# Patient Record
Sex: Female | Born: 1965 | Race: White | Hispanic: No | Marital: Married | Smoking: Former smoker
Health system: Southern US, Community
[De-identification: ages and names within clinical notes are randomized; demographics above are authoritative.]

## PROBLEM LIST (undated history)

## (undated) DIAGNOSIS — F419 Anxiety disorder, unspecified: Secondary | ICD-10-CM

## (undated) DIAGNOSIS — T7840XA Allergy, unspecified, initial encounter: Secondary | ICD-10-CM

## (undated) HISTORY — DX: Anxiety disorder, unspecified: F41.9

## (undated) HISTORY — PX: OTHER SURGICAL HISTORY: SHX169

## (undated) HISTORY — DX: Allergy, unspecified, initial encounter: T78.40XA

## (undated) HISTORY — PX: COLONOSCOPY: SHX174

---

## 1989-01-21 HISTORY — PX: MANDIBLE SURGERY: SHX707

## 2001-03-11 ENCOUNTER — Other Ambulatory Visit: Admission: RE | Admit: 2001-03-11 | Discharge: 2001-03-11 | Payer: Self-pay | Admitting: Obstetrics and Gynecology

## 2002-03-25 ENCOUNTER — Other Ambulatory Visit: Admission: RE | Admit: 2002-03-25 | Discharge: 2002-03-25 | Payer: Self-pay | Admitting: Obstetrics and Gynecology

## 2002-07-27 ENCOUNTER — Inpatient Hospital Stay (HOSPITAL_COMMUNITY): Admission: RE | Admit: 2002-07-27 | Discharge: 2002-07-31 | Payer: Self-pay | Admitting: Psychiatry

## 2002-08-02 ENCOUNTER — Other Ambulatory Visit (HOSPITAL_COMMUNITY): Admission: RE | Admit: 2002-08-02 | Discharge: 2002-08-06 | Payer: Self-pay | Admitting: Psychiatry

## 2002-08-21 ENCOUNTER — Inpatient Hospital Stay (HOSPITAL_COMMUNITY): Admission: AD | Admit: 2002-08-21 | Discharge: 2002-08-26 | Payer: Self-pay | Admitting: Psychiatry

## 2005-09-18 ENCOUNTER — Ambulatory Visit: Payer: Self-pay | Admitting: Sports Medicine

## 2005-10-16 ENCOUNTER — Ambulatory Visit: Payer: Self-pay | Admitting: Sports Medicine

## 2005-10-18 ENCOUNTER — Encounter: Admission: RE | Admit: 2005-10-18 | Discharge: 2005-10-18 | Payer: Self-pay | Admitting: Sports Medicine

## 2005-11-06 ENCOUNTER — Encounter: Admission: RE | Admit: 2005-11-06 | Discharge: 2005-11-06 | Payer: Self-pay | Admitting: Sports Medicine

## 2005-11-13 ENCOUNTER — Ambulatory Visit: Payer: Self-pay | Admitting: Sports Medicine

## 2006-06-06 ENCOUNTER — Ambulatory Visit: Payer: Self-pay | Admitting: Sports Medicine

## 2006-06-06 DIAGNOSIS — S86819A Strain of other muscle(s) and tendon(s) at lower leg level, unspecified leg, initial encounter: Secondary | ICD-10-CM

## 2006-06-06 DIAGNOSIS — S838X9A Sprain of other specified parts of unspecified knee, initial encounter: Secondary | ICD-10-CM | POA: Insufficient documentation

## 2010-02-10 ENCOUNTER — Encounter: Payer: Self-pay | Admitting: Sports Medicine

## 2010-06-08 NOTE — Discharge Summary (Signed)
[name], Wheeler NO.:  [id number]   MEDICAL RECORD NO.:  [id number]                   PATIENT TYPE:  IPS   LOCATION:  0507                                 FACILITY:  [facility]   PHYSICIAN:  Jeanice Lim, M.D.              DATE OF BIRTH:  1965/08/21   DATE OF ADMISSION:  08/21/2002  DATE OF DISCHARGE:  08/26/2002                                 DISCHARGE SUMMARY   IDENTIFYING DATA:  This is a 45 year old married Caucasian female  voluntarily admitted, reporting a history of depression, feeling hopeless,  decreased energy, having suicidal thoughts to overdose due to overwhelming  anxiety.   MEDICATIONS:  Paxil CR 12.5 mg, Risperdal 0.25 mg and Klonopin 0.25 mg  t.i.d.  The patient had stopped these medications due to being worried about  side effects but then resumed two of the medications two days prior to  admission.   ALLERGIES:  No known drug allergies.   PHYSICAL EXAMINATION:  Essentially within normal limits.  Neurologically  nonfocal.   LABORATORY DATA:  Routine admission labs within normal limits including CBC  and CMET.   MENTAL STATUS EXAM:  Alert, cooperative female casually dressed.  Fair eye  contact.  Speech clear.  Mood depressed and anxious.  The patient described  feeling sad and extremely anxious.  Affect was somewhat labile, falling over  the arms of the chair sobbing.  Thought processes were goal directed and  thought content positive for hopelessness, helplessness, worthlessness,  feeling overwhelmed, some questionable obsessive ruminations, somatic fears,  ambivalence regarding medications and no psychotic symptoms.  Reported  suicidal thoughts but contracted for safety in the hospital.  Cognitively  intact.  Judgment and insight fair to poor.   ADMISSION DIAGNOSES:   AXIS I:  1. Generalized anxiety disorder.  2. Rule out obsessive-compulsive disorder.  3. Depression not otherwise specified.   AXIS II:   Deferred.   AXIS III:  None.   AXIS IV:  Moderate (limited support system).   AXIS V:  25/65.   HOSPITAL COURSE:  The patient was admitted and ordered routine p.r.n.  medications and underwent further monitoring.  Was encouraged to participate  in individual, group and milieu therapy.  The patient was resumed on Paxil,  Risperdal, and Klonopin and birth control pill that she was taking prior to  admission.  Paxil was optimized.  Risperdal was decreased.  Klonopin was  adjusted to minimize side effects.  The patient was educated the  risk/benefit ratio and alternatives regarding the medications and side  effects were reviewed on multiple occasions, especially in light of this  patient's ambivalence regarding taking medications at all, which may have  contributed to her readmission.  The patient reported a positive response,  increased insight and no side effects from medications.   CONDITION ON DISCHARGE:  Some improvement in condition at the time of  discharge.  Mood was less anxious, less  depressed.  Affect brighter.  Thought processes goal directed.  Thought content with less obsessive  ruminating.  Thoughts more goal directed, more reality-based.  Increased  problem-solving skills and cognition was intact.   DISCHARGE MEDICATIONS:  1. Vistaril 50 mg q.6h. p.r.n. anxiety.  2. Klonopin 0.5 mg, 1/2 q.a.m. and 1 q.h.s.  3. Paxil CR 12.5 mg, 2 q.a.m. and 1 at 6 p.m.  4. Risperdal 0.25 mg q.h.s.   FOLLOW UP:  The patient was to follow up with Dr. Raquel James on Monday, September 06, 2002 at 9:15 a.m. and [facility] on Thursday, September 02, 2002 at 11  a.m.   DISCHARGE DIAGNOSES:   AXIS I:  1. Generalized anxiety disorder.  2. Rule out obsessive-compulsive disorder.  3. Depression not otherwise specified.   AXIS II:  Deferred.   AXIS III:  None.   AXIS IV:  Moderate (limited support system).   AXIS V:  Global Assessment of Functioning on discharge 55.                                                Jeanice Lim, M.D.    JEM/MEDQ  D:  09/25/2002  T:  09/26/2002  Job:  [id number]

## 2010-06-08 NOTE — Discharge Summary (Signed)
NAMEPREZLEY, QADIR NO.:  [id number]   MEDICAL RECORD NO.:  [id number]                   PATIENT TYPE:  IPS   LOCATION:  0508                                 FACILITY:  [facility]   PHYSICIAN:  Geoffery Lyons, M.D.                   DATE OF BIRTH:  03-May-1965   DATE OF ADMISSION:  07/27/2002  DATE OF DISCHARGE:  07/31/2002                                 DISCHARGE SUMMARY   CHIEF COMPLAINT AND PRESENT ILLNESS:  This was the first admission to [facility] for this 45 year old healthy female, presenting to  the hospital feeling suicidal with thoughts of overdosing on Ambien.  History of anxiety, worries and panic, intrusive thoughts.  One year had  fantasies of having sex with movie stars and these resolved.  Then obsessive  thoughts for periods of weeks, obsessed about meds, about marriage.  Sleeping decreased, frequent awakening, unable to concentrate, feels  hopeless.  Lexapro for one month, making her feel apathetic.   PAST PSYCHIATRIC HISTORY:  [place] was for first inpatient treatment.  Byrd Hesselbach is a Warden/ranger at [facility].   ALCOHOL/DRUG HISTORY:  Two to four beers occasionally.   PAST MEDICAL HISTORY:  Chronic low back pain.   MEDICATIONS:  Lexapro 10 mg per day, Xanax 0.25 mg twice a day.   PHYSICAL EXAMINATION:  Performed and failed to show any acute findings.   MENTAL STATUS EXAM:  Fully alert, anxious female.  Labile mood, tearful but  cooperative.  Speech normal rate, production, tempo but, at times, some  pressure.  Mood anxiety.  Affect anxiety, tearful.  Thought processes  logical and coherent.  Positive for suicidal ruminations.  Positive for  intrusive thoughts and agitation.  Cognition well-preserved.   ADMISSION DIAGNOSES:   AXIS I:  1. Anxiety disorder not otherwise specified.  2. Mood disorder not otherwise specified.   AXIS II:  No diagnosis.   AXIS III:  Low back pain.   AXIS IV:   AXIS V:   HOSPITAL COURSE:  She was admitted and started intensive individual and  group psychotherapy.  She was given some Ambien for sleep, Risperdal 0.25 mg  at bedtime, Lexapro 10 mg in the morning.  Lexapro was increased to 15 mg  but then discontinued.  She was started on Paxil CR 12.5 mg per day.  She  was dealing with a lot of anxiety, becoming overwhelmed, lots of  ruminations, obsessive thoughts that triggered her anxiety, feeling  overwhelmed, losing control, like she cannot take it anymore, thoughts about  death, suicide as an option, to the pain, to the anxiety.  Mood anxiety and  depression.  Affect anxiety and depression.  Thoughts were dealing with the  events, feeling paralyzed, fearful.  She felt that the Lexapro was affecting  her negatively.  We gave it a couple of tries and, even on the 5 mg, she was  doing okay and then slowly experienced a lot of anxiety and mood  fluctuations.  So we went ahead and switched to Effexor.  She endorsed  obsessive, intrusive thoughts about marriage, anxiety, stress of relaxation,  worried about medication.  We gradually worked on Pharmacologist and  cognitive behavior ways of dealing with the anxiety.  She tolerated Paxil  well.  We went ahead and discharged to outpatient follow-up.   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   AXIS II:  No diagnosis.   AXIS III:  Low back pain.   AXIS IV:  Moderate.   AXIS V:  Global Assessment of Functioning upon discharge 55-60.   DISCHARGE MEDICATIONS:  1. Risperdal 0.25 mg at bedtime.  2. Xanax 0.25 mg twice a day.  3. Paxil CR 12.5 mg per day.   FOLLOW UP:  [facility] with Dr. Raquel James and mental health IOP.                                               Geoffery Lyons, M.D.    IL/MEDQ  D:  08/25/2002  T:  08/26/2002  Job:  [id number]

## 2010-06-08 NOTE — H&P (Signed)
Lori Wheeler, Lori NO.:  [id number]   MEDICAL RECORD NO.:  [id number]                   PATIENT TYPE:  IPS   LOCATION:  0507                                 FACILITY:  [facility]   PHYSICIAN:  Geoffery Lyons, M.D.                   DATE OF BIRTH:  [birth year]   DATE OF ADMISSION:  08/21/2002  DATE OF DISCHARGE:                         PSYCHIATRIC ADMISSION ASSESSMENT   IDENTIFYING INFORMATION:  This is a 45 year old married white female who is  a voluntary admission.   HISTORY OF PRESENT ILLNESS:  This 45 year old healthy female presented to  the hospital feeling suicidal, with thoughts of overdosing on her Ambien.  She has a history of 1 year of anxiety, with waves of panic and intrusive  thoughts of various types of fantasies, including having sex with movie  stars.  This seemed to resolve at one point during the year and then she  found herself with various types of obsessive thinking about her  medications, about her marriage, feeling unable to quiet her mind.  Her  sleep has been poor with frequent awakenings for the past 4-6 weeks.  She is  unable to concentrate, particularly at night, and feels hopeless about her  symptoms.  Her Lexapro she has taken for the past month she reports is  making her feel somewhat apathetic and numb.  She has never taken any other  type of medication.  The patient denies any homicidal ideation.  She  endorses suicidal thoughts with a place to overdose on Ambien.   PAST PSYCHIATRIC HISTORY:  The patient is followed by Dr. Jules Schick who  she has seen for only one visit.  This is her first inpatient treatment.  Prior to seeing Dr. Raquel James, her medications were managed by her primary  care physician.  She also sees Hilda Lias, a Warden/ranger at [employer].   SOCIAL HISTORY:  The patient is a married white female who is college  educated, with a Medical illustrator in Furniture conservator/restorer.  She works with  computers, has no  children, and lives at home with her husband.  No legal  charges.  Denies any history of substance abuse.   FAMILY HISTORY:  Remarkable for her mother with a history of addiction to  diet pills.  No family history of bipolar illness.   ALCOHOL AND DRUG HISTORY:  The patient does report she drinks somewhere  between 2-4 beers over the course of the week, has no symptoms of  withdrawal.   PAST MEDICAL HISTORY:  The patient is followed by Dr. Blossom Hoops, M.D., who  is her primary care physician.  Medical problems are chronic low back pain,  right elbow strain.  Past medical history is remarkable for no history of  seizures and the patient does have history of left jaw surgery in the  distant past.   MEDICATIONS:  The patient has been taking Lexapro 10 mg p.o. daily for  the  past month.  She also takes Xanax 0.25 mg approximately 2 times daily for  the past 6 weeks and takes oral contraceptives and is unable to remember the  name of the contraceptive.   DRUG ALLERGIES:  None.   REVIEW OF SYSTEMS:  The patient scores her low back pain as chronic, usually  relieved by ibuprofen or over-the-counter medications.  She scores it today  a 2/10.  She has no history of urinary tract infection or dysuria symptoms.   POSITIVE PHYSICAL FINDINGS:  A well-nourished, well-developed female, 5 feet  2 inches tall, 112 pounds.  Temperature 99.1, pulse 69, respirations 22,  blood pressure 130/87.  HEAD:  Normocephalic and atraumatic.  EENT:  PERRLA.  Sclerae are nonicteric.  NECK:  No thyromegaly, no lymphadenopathy.  CARDIOVASCULAR:  S1 and S2 is heard, regular rate and rhythm, no clicks,  murmurs, gallops or extra sounds.  LUNGS:  Clear to auscultation.  ABDOMEN:  Soft, nontender.  Bowel sounds within normal limits.  GENITALIA:  Deferred.  MUSCULOSKELETAL:  Normal gait, no swelling or erythema of any joints.  EXTREMITIES:  Pink and warm, no evidence of edema.  NEURO:  Cranial nerves II-XII intact.   EOMs intact with 1 beat of nystagmus  to the right.  Facial symmetry is present.  Grip strength equal bilaterally.  Deep tendon reflexes 3+/5 and are brisk.  Romberg without findings.   DIAGNOSTIC STUDIES:  CBC within normal limits.  Normal routine chemistry  although her potassium was very mildly decreased at 3.2 on a scale of 3.5-5  being normal.  Her total bilirubin was mildly elevated at 1.3 on a normal  scale of 0.3-1.2.  AST and ALT and alkaline phosphatase were normal.  Thyroid panel is currently pending..  Urine pregnancy test was negative.  Urine drug screen was positive for benzodiazepines, negative for all other  substances.   MENTAL STATUS EXAM:  This is a fully alert female with an anxious affect,  somewhat labile mood, generally cooperative, somewhat tearful at times.  Speech is within normal limits, no latency, no pressure.  Mood is anxious.  Thought process is logical and coherent, positive for suicidal ideation,  feeling hopeless and concerned about her own symptoms.  No homicidal  ideation.  No clear plan for suicidal action.  She does seem to be having  fairly consistent intrusive thoughts and some thought agitation from time to  time.  No clear auditory or visual hallucinations.  Cognitively she is  intact and oriented x3.   ADMISSION DIAGNOSIS:   AXIS I:  1. Anxiety disorder not otherwise specified.  2. Rule out Bipolar I.   AXIS II:  No diagnosis.   AXIS III:  Low back pain not otherwise specified.   AXIS IV:  Deferred.   AXIS V:  Current 35, past year 80.   INITIAL PLAN OF CARE:  To voluntarily admit the patient with q.15 minute  checks in place.  We are going to add Risperdal 0.25 mg p.o. q.h.s. and we  will continue her Xanax as is at 0.25 mg p.o. q.a.m.  We will continue her  Lexapro at 10 mg p.o. q.a.m. and Ambien 10 mg at h.s. p.r.n. and we will go from there to see if this will help her control her obsessive thinking and  impulsive thoughts, and  we will allow her to take her own oral contraceptive  tablets.  We have discussed the plan with her and she has asked some  pertinent questions.  We are going to give her  specific written information about the medications.  She is participating  appropriately in individual and group psychotherapy.  We will adjust  medications and will follow.   ESTIMATED LENGTH OF STAY:  5 days.      Margaret A. Scott, N.P.                   Geoffery Lyons, M.D.    MAS/MEDQ  D:  08/25/2002  T:  08/25/2002  Job:  [id number]

## 2010-06-08 NOTE — H&P (Signed)
Lori Wheeler, PEYSER NO.:  [id number]   MEDICAL RECORD NO.:  [id number]                   PATIENT TYPE:  IPS   LOCATION:  0507                                 FACILITY:  [facility]   PHYSICIAN:  Jeanice Lim, M.D.              DATE OF BIRTH:  [birth year]   DATE OF ADMISSION:  08/21/2002  DATE OF DISCHARGE:  08/26/2002                         PSYCHIATRIC ADMISSION ASSESSMENT   IDENTIFYING INFORMATION:  A 45 year old married white female, voluntarily  admitted on August 21, 2002.   HISTORY OF PRESENT ILLNESS:  The patient presents with a history of  depression, fell into despair, feeling hopeless, reports decreased energy.  She states that she has tried positive thinking but that is not working.  She has been having suicidal thoughts to overdose due to overwhelming  anxiety.  She states she is afraid of everything.  She is wondering if she  no longer loves her husband.  She is not as functional.  Afraid to drive.  She feels that she is a failure.  She is also very afraid to take her  medicines.  She states they are making her feel numb and considers she may  become addicted to her medications.  Her sleep has been decreased, her  appetite has been decreased.  She reports no weight loss.  Denies any  psychotic symptoms.   PAST PSYCHIATRIC HISTORY:  Second visit to [facility].  The  patient was here 3 weeks ago for suicidal ideation.  She went for her  outpatient appointment.  She sees Dr. Jules Schick and therapist Nichola Sizer.   SOCIAL HISTORY:  She is a 45 year old married white female, married for 9  years, first marriage, no children.  She lives with her husband.  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  She has completed her  bachelor's in computer programming.   FAMILY HISTORY:  None.   ALCOHOL DRUG HISTORY:  Nonsmoker, denies any alcohol or drug use.   PAST MEDICAL HISTORY:  Primary care Luvia Orzechowski is Dr.  Blossom Hoops at [facility].  Medical problems are none.   MEDICATIONS:  Paxil CR 12.5 mg daily, Risperdal 0.25 mg in the morning,  Klonopin 0.25 mg t.i.d.  The patient states she stopped these medications  because of being worried over the side effects but resumed her medications 2  days ago.   DRUG ALLERGIES:  No known allergies.   REVIEW OF SYSTEMS:  No cardiac, pulmonary, neurological problems.  She is a  nonsmoker.  No endocrine, GU, or GI, or reproductive problems.  Sustained an  injury to her neck and back a year ago.  Denies any chronic pain.   PHYSICAL EXAMINATION:  Vital signs 98.1, 96 heart rate, 18 respirations,  blood pressure 112/56.  The patient is 5 feet 3 inches tall, she is 111  pounds.  This is a thin, anxious appearing female.  HEAD:  Normocephalic and atraumatic.  CHEST:  Respirations are easy.  SKIN COLOR:  Olive toned, no rashes or lacerations were noted.  MUSCULOSKELETAL:  The patient walks very symmetrically.  NEURO:  Nonfocal findings.   LABORATORY DATA:  CBC within normal limits.  CMET within normal limits.   MENTAL STATUS EXAM:  She is an alert, cooperative female, casually dressed,  fair eye contact.  Speech is clear.  Mood:  The patient feels sad and  extremely anxious.  Her affect is labile, falls over the arms of her chair  sobbing.  Thought process is hopeless, overwhelmed, some questionable  obsessions and worries.  No hallucinations, expressing suicidal ideation but  promises safety.  Cognitive function intact.  Memory is good, judgment is  fair, insight is partial.   ADMISSION DIAGNOSES:   AXIS I:  Generalized anxiety disorder rule out bipolar disorder.   AXIS II:  Deferred.   AXIS III:  None.   AXIS IV:  Other psychosocial problems.   AXIS V:  Current is 25, past year 32.   PLAN:  Voluntary admission for anxiety and suicidal ideation.  Check every  15 minutes.  We will check labs as needed.  Stabilize mood and thinking so   the patient can be safe and functional.  We will continue with Klonopin and  decrease Paxil for now if it may be adding to mood instability.  Will  increased Risperdal for obsessive thinking.  Will have a family session.  Will consider a mood stabilizer. Will continue to educate the patient on her  medications and reinforce medication compliance.  The patient is to follow  up with Dr. Raquel James and her therapist.   TENTATIVE LENGTH OF CARE:  4-6 days.       Landry Corporal, N.P.                       Jeanice Lim, M.D.    JO/MEDQ  D:  08/26/2002  T:  08/26/2002  Job:  [id number]

## 2011-05-14 ENCOUNTER — Other Ambulatory Visit: Payer: Self-pay | Admitting: Family Medicine

## 2011-07-01 ENCOUNTER — Telehealth: Payer: Self-pay

## 2011-07-01 NOTE — Telephone Encounter (Signed)
Lori Wheeler at [facility] pharmacy [phone]  States he has sent two requests for prenatal vitamins 30 day supply may 10 last fill date.

## 2011-07-02 ENCOUNTER — Encounter: Payer: Self-pay | Admitting: Family Medicine

## 2011-07-02 MED ORDER — PRENATAL VITAMINS (DIS) PO TABS: 1.0000 | ORAL_TABLET | Freq: Every day | ORAL | Status: DC

## 2011-07-02 NOTE — Telephone Encounter (Signed)
Rx done and sent to pharmacy 

## 2011-07-02 NOTE — Telephone Encounter (Signed)
Chart is at nurses station for review 

## 2011-07-24 ENCOUNTER — Ambulatory Visit (INDEPENDENT_AMBULATORY_CARE_PROVIDER_SITE_OTHER): Payer: BC Managed Care – PPO | Admitting: Family Medicine

## 2011-07-24 ENCOUNTER — Encounter: Payer: Self-pay | Admitting: Family Medicine

## 2011-07-24 VITALS — BP 124/84 | HR 62 | Temp 97.2°F | Resp 16 | Ht 62.5 in | Wt 161.2 lb

## 2011-07-24 DIAGNOSIS — E7800 Pure hypercholesterolemia, unspecified: Secondary | ICD-10-CM

## 2011-07-24 DIAGNOSIS — E7801 Familial hypercholesterolemia: Secondary | ICD-10-CM

## 2011-07-24 DIAGNOSIS — E663 Overweight: Secondary | ICD-10-CM

## 2011-07-24 DIAGNOSIS — Z0000 Encounter for general adult medical examination without abnormal findings: Secondary | ICD-10-CM

## 2011-07-24 LAB — POCT URINALYSIS DIPSTICK
Protein, UA: NEGATIVE
Spec Grav, UA: 1.015
Urobilinogen, UA: 0.2

## 2011-07-24 MED ORDER — PAROXETINE HCL 10 MG PO TABS: 10.0000 mg | ORAL_TABLET | ORAL | Status: DC

## 2011-07-24 NOTE — Progress Notes (Signed)
  Subjective:    Patient ID: Lori Wheeler, female    DOB: 04-02-1965, 46 y.o.   MRN: [id number]  HPI   THis 46 y.o. Female is here for CPE; PAPs are done at [facility] (OCPs prescribed by  M.D. There). The pt is married, works as a Quarry manager, is a nonsmoker and consumes beer regularly.  Exercise- runner.    Last PAP: 07/2010- normal  Last MMG: 07/2009- normal/neg    Review of Systems  Constitutional: Negative.   HENT: Negative.   Eyes: Negative.   Respiratory: Negative.   Cardiovascular: Negative.   Gastrointestinal: Negative.   Genitourinary: Negative.   Musculoskeletal: Negative.   Skin: Negative.   Neurological: Negative.   Hematological: Negative.   Psychiatric/Behavioral: Negative.        Objective:   Physical Exam  Nursing note and vitals reviewed. Constitutional: She is oriented to person, place, and time. She appears well-developed and well-nourished. No distress.  HENT:  Head: Normocephalic and atraumatic.  Right Ear: Hearing, tympanic membrane, external ear and ear canal normal.  Left Ear: Hearing, tympanic membrane, external ear and ear canal normal.  Nose: Nose normal.  Mouth/Throat: Uvula is midline, oropharynx is clear and moist and mucous membranes are normal. Normal dentition.  Eyes: Conjunctivae, EOM and lids are normal. Pupils are equal, round, and reactive to light. No scleral icterus.  Neck: Normal range of motion. Neck supple. No thyromegaly present.  Cardiovascular: Normal rate, regular rhythm, normal heart sounds and intact distal pulses.  Exam reveals no gallop and no friction rub.   No murmur heard. Pulmonary/Chest: Effort normal and breath sounds normal. No respiratory distress. She has no wheezes. Right breast exhibits no inverted nipple, no mass, no nipple discharge, no skin change and no tenderness. Left breast exhibits no inverted nipple, no mass, no nipple discharge and no tenderness. Breasts are symmetrical.  Abdominal: Bowel  sounds are normal. She exhibits no mass. There is no hepatosplenomegaly. There is no tenderness. There is no guarding and no CVA tenderness.  Musculoskeletal: Normal range of motion. She exhibits no edema and no tenderness.  Lymphadenopathy:    She has no cervical adenopathy.  Neurological: She is alert and oriented to person, place, and time. She has normal reflexes. No cranial nerve deficit. She exhibits normal muscle tone. Coordination normal.  Skin: Skin is warm and dry. No rash noted. No pallor.  Psychiatric: She has a normal mood and affect. Her behavior is normal. Judgment and thought content normal.          Assessment & Plan:   1. Routine general medical examination at a health care facility  POCT urinalysis dipstick  2. Familial hypercholesterolemia  Lipid panel  3. Overweight (BMI 25.0-29.9)  Encourage continued exercise and better nutrition with portion control to achieve weight loss goal

## 2011-07-24 NOTE — Progress Notes (Signed)
  Subjective:    Patient ID: Lori Wheeler, female    DOB: [birth year], 46 y.o.   MRN: [id number]  HPI    Review of Systems     Objective:   Physical Exam        Assessment & Plan:

## 2011-07-24 NOTE — Patient Instructions (Addendum)
Keeping You Healthy  Get These Tests 1. Blood Pressure- Have your blood pressure checked once a year by your health care provider.  Normal blood pressure is 120/80. 2. Weight- Have your body mass index (BMI) calculated to screen for obesity.  BMI is measure of body fat based on height and weight.  You can also calculate your own BMI at https://www.west-esparza.com/. 3. Cholesterol- Have your cholesterol checked every 5 years starting at age 44 then yearly starting at age 43. 4. Chlamydia, HIV, and other sexually transmitted diseases- Get screened every year until age 57, then within three months of each new sexual provider. 5. Pap Smear- Every 1-3 years; discuss with your health care provider. 6. Mammogram- Every year starting at age 3  Take these medicines  Calcium with Vitamin D-Your body needs 1200 mg of Calcium each day and [phone] IU of Vitamin D daily.  Your body can only absorb 500 mg of Calcium at a time so Calcium must be taken in 2 or 3 divided doses throughout the day.  Multivitamin with folic acid- Once daily if it is possible for you to become pregnant.  Get these Immunizations  Gardasil-Series of three doses; prevents HPV related illness such as genital warts and cervical cancer.  Menactra-Single dose; prevents meningitis.  Tetanus shot- Every 10 years.  Check with your GYN office to see if you were given the Tdap in 2004-2005. If not, you will need 1 dose of this vaccine.  Flu shot-Every year.  Take these steps 1. Do not smoke-Your healthcare provider can help you quit.  For tips on how to quit go to www.smokefree.gov or call 1-800 QUITNOW. 2. Be physically active- Exercise 5 days a week for at least 30 minutes.  If you are not already physically active, start slow and gradually work up to 30 minutes of moderate physical activity.  Examples of moderate activity include walking briskly, dancing, swimming, bicycling, etc. 3. Breast Cancer- A self breast exam every month is  important for early detection of breast cancer.  For more information and instruction on self breast exams, ask your healthcare provider or [email or web address]. 4. Eat a healthy diet- Eat a variety of healthy foods such as fruits, vegetables, whole grains, low fat milk, low fat cheeses, yogurt, lean meats, poultry and fish, beans, nuts, tofu, etc.  For more information go to www. Thenutritionsource.org 5. Drink alcohol in moderation- Limit alcohol intake to one drink or less per day. Never drink and drive. 6. Depression- Your emotional health is as important as your physical health.  If you're feeling down or losing interest in things you normally enjoy please talk to your healthcare provider about being screened for depression. 7. Dental visit- Brush and floss your teeth twice daily; visit your dentist twice a year. 8. Eye doctor- Get an eye exam at least every 2 years. 9. Helmet use- Always wear a helmet when riding a bicycle, motorcycle, rollerblading or skateboarding. 10. Safe sex- If you may be exposed to sexually transmitted infections, use a condom. 11. Seat belts- Seat belts can save your live; always wear one. 12. Smoke/Carbon Monoxide detectors- These detectors need to be installed on the appropriate level of your home. Replace batteries at least once a year. 13. Skin cancer- When out in the sun please cover up and use sunscreen 15 SPF or higher. 14. Violence- If anyone is threatening or hurting you, please tell your healthcare provider.

## 2011-07-25 LAB — LIPID PANEL
Cholesterol: 205 mg/dL — ABNORMAL HIGH (ref 0–200)
LDL Cholesterol: 106 mg/dL — ABNORMAL HIGH (ref 0–99)
VLDL: 29 mg/dL (ref 0–40)

## 2011-08-01 ENCOUNTER — Encounter: Payer: Self-pay | Admitting: Family Medicine

## 2011-08-01 ENCOUNTER — Encounter: Payer: Self-pay | Admitting: *Deleted

## 2011-08-01 NOTE — Progress Notes (Signed)
Quick Note:  Please notify pt that results are normal.   Provide pt with copy of labs. ______ 

## 2012-05-17 ENCOUNTER — Ambulatory Visit (INDEPENDENT_AMBULATORY_CARE_PROVIDER_SITE_OTHER): Payer: BC Managed Care – PPO | Admitting: Family Medicine

## 2012-05-17 VITALS — BP 102/64 | HR 95 | Temp 99.2°F | Resp 18 | Ht 62.5 in | Wt 166.0 lb

## 2012-05-17 DIAGNOSIS — N390 Urinary tract infection, site not specified: Secondary | ICD-10-CM

## 2012-05-17 DIAGNOSIS — J069 Acute upper respiratory infection, unspecified: Secondary | ICD-10-CM

## 2012-05-17 MED ORDER — HYDROCODONE-HOMATROPINE 5-1.5 MG/5ML PO SYRP: 5.0000 mL | ORAL_SOLUTION | Freq: Three times a day (TID) | ORAL | Status: DC | PRN

## 2012-05-17 MED ORDER — AZITHROMYCIN 250 MG PO TABS: ORAL_TABLET | ORAL | Status: DC

## 2012-05-17 NOTE — Progress Notes (Signed)
Cough began yesterday (husband just had the flu)   Generalized Body Aches    Shortness of Breath    Sore Throat    Patient has been feeling bad for two weeks, but the cough began yesterday.  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  Associated symptoms:  Night sweats and chills  PMHx:  Non smoker, no h/o asthma  Objective:  NAD HEENT:  Mild oroph erythema Neck:  Supple, no adenopathy Chest:  Few ronchi Heart:  Reg, no murmur  Assessment:  URI  Plan:  z pak and hydromet  Signed,  Elvina Sidle, MD

## 2012-08-10 ENCOUNTER — Other Ambulatory Visit: Payer: Self-pay | Admitting: Family Medicine

## 2012-08-12 NOTE — Telephone Encounter (Signed)
Needs OV.  

## 2012-08-20 ENCOUNTER — Encounter: Payer: Self-pay | Admitting: Family Medicine

## 2012-08-20 ENCOUNTER — Ambulatory Visit (INDEPENDENT_AMBULATORY_CARE_PROVIDER_SITE_OTHER): Payer: BC Managed Care – PPO | Admitting: Family Medicine

## 2012-08-20 VITALS — BP 123/81 | HR 68 | Temp 97.3°F | Resp 16 | Ht 63.5 in | Wt 166.0 lb

## 2012-08-20 DIAGNOSIS — G8929 Other chronic pain: Secondary | ICD-10-CM

## 2012-08-20 DIAGNOSIS — M25521 Pain in right elbow: Secondary | ICD-10-CM

## 2012-08-20 DIAGNOSIS — M25529 Pain in unspecified elbow: Secondary | ICD-10-CM

## 2012-08-20 DIAGNOSIS — F411 Generalized anxiety disorder: Secondary | ICD-10-CM

## 2012-08-20 MED ORDER — PAROXETINE HCL 10 MG PO TABS: 10.0000 mg | ORAL_TABLET | ORAL | Status: DC

## 2012-08-20 NOTE — Patient Instructions (Signed)
You have been referred to [facility] Medicine [facility] for thorough evaluation of right elbow pain. Continue to wear the sleeve and use a topical analgesic as well as try an OTC medication for pain and inflammation (Aleve or Ibuprofen) as end of the day. A staff member will contact you with the information about your appointment to [facility] Med Clinic.

## 2012-08-20 NOTE — Progress Notes (Signed)
S:  This 47 y.o. Cauc female has chronic anxiety, well controlled on Paroxetine 10 mg; she reports no adverse effects. Pt is requesting refills. She does not report recent panic attacks, CP or tightness, diaphoresis, palpitations, SOB or dyspnea, GI upset or decrease appetite, HA, dizziness, tremor, sleep disturbance, agitation or confusion or concentration difficulties.   Pt c/o recurrent right elbow pain; she had similar pain and tightness around R elbow years ago. Pt self- diagnosed as "tennis elbow" and Neoprene sleeve is helpful. She has tried massage and discontinued physical activity that aggravates pain (plank poses, reaching out and behind her, etc). Pt denies paresthesias, weakness or numbness in arm. There is no hx of acute injury. She has not taken any NSAIDs or tried any topical analgesic as she wanted to get an accurate diagnosis.  Patient Active Problem List   Diagnosis Date Noted  . MUSCLE STRAIN, HAMSTRING MUSCLE 06/06/2006   PMHx, Soc Hx and Fam Hx reviewed.  ROS: As per HPI; otherwise noncontributory.  O: Filed Vitals:   08/20/12 1007  BP: 123/81  Pulse: 68  Temp: 97.3 F (36.3 C)  Resp: 16   GEN: In NAD; WN,WD. HENT: /AT; EOMI w/ clear conj/ sclerae. Otherwise unremarkable. COR: RRR. LUNGS: Normal resp rate and effort. SKIN: W&D; no erythema or pallor. MS: R arm/elbow- no deformity or effusion. Full ROM but expressed discomfort w/ full extension and internal/ext rotation.          Minimal point tenderness over olecranon process. NEURO: A&O x 3; CNs intact. Nonfocal.  A/P: Elbow pain, chronic, right - Plan: Ambulatory referral to Sports Medicine- Continue to wear sleeve and try oral NSAID as well as topical analgesic.  Anxiety state, unspecified- Stable on current medication.  Meds ordered this encounter  Medications  . PARoxetine (PAXIL) 10 MG tablet    Sig: Take 1 tablet (10 mg total) by mouth every morning.    Dispense:  90 tablet    Refill:  3

## 2012-08-21 ENCOUNTER — Ambulatory Visit: Payer: BC Managed Care – PPO | Admitting: Family Medicine

## 2012-08-28 ENCOUNTER — Ambulatory Visit (INDEPENDENT_AMBULATORY_CARE_PROVIDER_SITE_OTHER): Payer: BC Managed Care – PPO | Admitting: Sports Medicine

## 2012-08-28 ENCOUNTER — Encounter: Payer: Self-pay | Admitting: Sports Medicine

## 2012-08-28 VITALS — BP 111/84 | HR 73 | Ht 62.5 in | Wt 166.0 lb

## 2012-08-28 DIAGNOSIS — M25529 Pain in unspecified elbow: Secondary | ICD-10-CM

## 2012-08-28 DIAGNOSIS — M7711 Lateral epicondylitis, right elbow: Secondary | ICD-10-CM

## 2012-08-28 DIAGNOSIS — M25521 Pain in right elbow: Secondary | ICD-10-CM | POA: Insufficient documentation

## 2012-08-28 DIAGNOSIS — M7710 Lateral epicondylitis, unspecified elbow: Secondary | ICD-10-CM

## 2012-08-28 NOTE — Progress Notes (Signed)
  Subjective:    Patient ID: Lori Wheeler, female    DOB: 1965/09/15, 47 y.o.   MRN: [id number]  HPI This is a 47 year old white female who presents to the sports medicine clinic today for evaluation of right elbow pain onset 2 months ago. She started at a running routine as well as core exercises which included planks and carrying a water bottle while she runs.  She does pain on the outside of her right elbow worse with lifting things up or direct touch. 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 No weakness. No traumatic injury noted. She had a similar episode but milder 2 years ago but she did not seek medical attention for and resolved on its own. She has not been taking any anti-inflammatories. She has been trying to stretch.  Past Medical History  Diagnosis Date  . Anxiety   . Allergy    Past Surgical History  Procedure Laterality Date  . Mandible surgery  1991   No Known Allergies    Review of Systems As per history of present illness otherwise negative.vs     Objective:   Physical Exam BP 111/84  Pulse 73  Ht 5' 2.5" (1.588 m)  Wt 166 lb (75.297 kg)  BMI 29.86 kg/m2 This is a well-developed well-nourished pleasant 47 year old female in no acute distress alert and oriented.  Right elbow: Tender to palpation over the lateral epicondyle. No significant joint effusion full range of motion to flexion extension pronation and supination Strength 5 out of 5 in all directions Pain reproduced with forced dorsiflexion of the right wrist No ecchymosis noted  Examination of the left elbow is unremarkable.  The bilateral upper extremities neurovascularly intact with equal pulses  Musculoskeletal ultrasound of the right elbow Ultrasound was performed with visualization of the right elbow. Views were obtained longitudinally and transverse. The lateral epicondyle tendon andradiohumeral joint well visualized. Evidence of fluid within the  common extensor tendon was seen consistent with lateral epicondylitis no evidence of tear was noted however.

## 2012-08-28 NOTE — Assessment & Plan Note (Signed)
A wrist immobilization splint was given today to help prevent flexion-extension of the wrist. She was given a prescription for physical therapy with Jacquiline Doe.  She declined anti-inflammatories at this time. She'll followup in 4 weeks for reevaluation. At that time or if her symptoms do not improve we will discuss corticosteroid injection or topical nitroglycerin therapy

## 2012-08-28 NOTE — Patient Instructions (Addendum)
Lateral Epicondylitis (Tennis Elbow) with Rehab Lateral epicondylitis involves inflammation and pain around the outer portion of the elbow. The pain is caused by inflammation of the tendons in the forearm that bring back (extend) the wrist. Lateral epicondylittis is also called tennis elbow, because it is very common in tennis players. However, it may occur in any individual who extends the wrist repetitively. If lateral epicondylitis is left untreated, it may become a chronic problem. SYMPTOMS   Pain, tenderness, and inflammation on the outer (lateral) side of the elbow.  Pain or weakness with gripping activities.  Pain that increases with wrist twisting motions (playing tennis, using a screwdriver, opening a door or a jar).  Pain with lifting objects, including a coffee cup. CAUSES  Lateral epicondylitis is caused by inflammation of the tendons that extend the wrist. Causes of injury may include:  Repetitive stress and strain on the muscles and tendons that extend the wrist.  Sudden change in activity level or intensity.  Incorrect grip in racquet sports.  Incorrect grip size of racquet (often too large).  Incorrect hitting position or technique (usually backhand, leading with the elbow).  Using a racket that is too heavy. RISK INCREASES WITH:  Sports or occupations that require repetitive and/or strenuous forearm and wrist movements (tennis, squash, racquetball, carpentry).  Poor wrist and forearm strength and flexibility.  Failure to warm up properly before activity.  Resuming activity before healing, rehabilitation, and conditioning are complete. PREVENTION   Warm up and stretch properly before activity.  Maintain physical fitness:  Strength, flexibility, and endurance.  Cardiovascular fitness.  Wear and use properly fitted equipment.  Learn and use proper technique and have a coach correct improper technique.  Wear a tennis elbow (counterforce) brace. PROGNOSIS   The course of this condition depends on the degree of the injury. If treated properly, acute cases (symptoms lasting less than 4 weeks) are often resolved in 2 to 6 weeks. Chronic (longer lasting cases) often resolve in 3 to 6 months, but may require physical therapy. RELATED COMPLICATIONS   Frequently recurring symptoms, resulting in a chronic problem. Properly treating the problem the first time decreases frequency of recurrence.  Chronic inflammation, scarring tendon degeneration, and partial tendon tear, requiring surgery.  Delayed healing or resolution of symptoms. TREATMENT  Treatment first involves the use of ice and medicine, to reduce pain and inflammation. Strengthening and stretching exercises may help reduce discomfort, if performed regularly. These exercises may be performed at home, if the condition is an acute injury. Chronic cases may require a referral to a physical therapist for evaluation and treatment. Your caregiver may advise a corticosteroid injection, to help reduce inflammation. Rarely, surgery is needed. MEDICATION  If pain medicine is needed, nonsteroidal anti-inflammatory medicines (aspirin and ibuprofen), or other minor pain relievers (acetaminophen), are often advised.  Do not take pain medicine for 7 days before surgery.  Prescription pain relievers may be given, if your caregiver thinks they are needed. Use only as directed and only as much as you need.  Corticosteroid injections may be recommended. These injections should be reserved only for the most severe cases, because they can only be given a certain number of times. HEAT AND COLD  Cold treatment (icing) should be applied for 10 to 15 minutes every 2 to 3 hours for inflammation and pain, and immediately after activity that aggravates your symptoms. Use ice packs or an ice massage.  Heat treatment may be used before performing stretching and strengthening activities prescribed by your   caregiver, physical  therapist, or athletic trainer. Use a heat pack or a warm water soak. SEEK MEDICAL CARE IF: Symptoms get worse or do not improve in 2 weeks, despite treatment. EXERCISES  RANGE OF MOTION (ROM) AND STRETCHING EXERCISES - Epicondylitis, Lateral (Tennis Elbow) These exercises may help you when beginning to rehabilitate your injury. Your symptoms may go away with or without further involvement from your physician, physical therapist or athletic trainer. While completing these exercises, remember:   Restoring tissue flexibility helps normal motion to return to the joints. This allows healthier, less painful movement and activity.  An effective stretch should be held for at least 30 seconds.  A stretch should never be painful. You should only feel a gentle lengthening or release in the stretched tissue. RANGE OF MOTION  Wrist Flexion, Active-Assisted  Extend your right / left elbow with your fingers pointing down.*  Gently pull the back of your hand towards you, until you feel a gentle stretch on the top of your forearm.  Hold this position for __________ seconds. Repeat __________ times. Complete this exercise __________ times per day.  *If directed by your physician, physical therapist or athletic trainer, complete this stretch with your elbow bent, rather than extended. RANGE OF MOTION  Wrist Extension, Active-Assisted  Extend your right / left elbow and turn your palm upwards.*  Gently pull your palm and fingertips back, so your wrist extends and your fingers point more toward the ground.  You should feel a gentle stretch on the inside of your forearm.  Hold this position for __________ seconds. Repeat __________ times. Complete this exercise __________ times per day. *If directed by your physician, physical therapist or athletic trainer, complete this stretch with your elbow bent, rather than extended. STRETCH - Wrist Flexion  Place the back of your right / left hand on a tabletop,  leaving your elbow slightly bent. Your fingers should point away from your body.  Gently press the back of your hand down onto the table by straightening your elbow. You should feel a stretch on the top of your forearm.  Hold this position for __________ seconds. Repeat __________ times. Complete this stretch __________ times per day.  STRETCH  Wrist Extension   Place your right / left fingertips on a tabletop, leaving your elbow slightly bent. Your fingers should point backwards.  Gently press your fingers and palm down onto the table by straightening your elbow. You should feel a stretch on the inside of your forearm.  Hold this position for __________ seconds. Repeat __________ times. Complete this stretch __________ times per day.  STRENGTHENING EXERCISES - Epicondylitis, Lateral (Tennis Elbow) These exercises may help you when beginning to rehabilitate your injury. They may resolve your symptoms with or without further involvement from your physician, physical therapist or athletic trainer. While completing these exercises, remember:   Muscles can gain both the endurance and the strength needed for everyday activities through controlled exercises.  Complete these exercises as instructed by your physician, physical therapist or athletic trainer. Increase the resistance and repetitions only as guided.  You may experience muscle soreness or fatigue, but the pain or discomfort you are trying to eliminate should never worsen during these exercises. If this pain does get worse, stop and make sure you are following the directions exactly. If the pain is still present after adjustments, discontinue the exercise until you can discuss the trouble with your caregiver. STRENGTH Wrist Flexors  Sit with your right / left forearm palm-up and   fully supported on a table or countertop. Your elbow should be resting below the height of your shoulder. Allow your wrist to extend over the edge of the  surface.  Loosely holding a __________ weight, or a piece of rubber exercise band or tubing, slowly curl your hand up toward your forearm.  Hold this position for __________ seconds. Slowly lower the wrist back to the starting position in a controlled manner. Repeat __________ times. Complete this exercise __________ times per day.  STRENGTH  Wrist Extensors  Sit with your right / left forearm palm-down and fully supported on a table or countertop. Your elbow should be resting below the height of your shoulder. Allow your wrist to extend over the edge of the surface.  Loosely holding a __________ weight, or a piece of rubber exercise band or tubing, slowly curl your hand up toward your forearm.  Hold this position for __________ seconds. Slowly lower the wrist back to the starting position in a controlled manner. Repeat __________ times. Complete this exercise __________ times per day.  STRENGTH - Ulnar Deviators  Stand with a ____________________ weight in your right / left hand, or sit while holding a rubber exercise band or tubing, with your healthy arm supported on a table or countertop.  Move your wrist, so that your pinkie travels toward your forearm and your thumb moves away from your forearm.  Hold this position for __________ seconds and then slowly lower the wrist back to the starting position. Repeat __________ times. Complete this exercise __________ times per day STRENGTH - Radial Deviators  Stand with a ____________________ weight in your right / left hand, or sit while holding a rubber exercise band or tubing, with your injured arm supported on a table or countertop.  Raise your hand upward in front of you or pull up on the rubber tubing.  Hold this position for __________ seconds and then slowly lower the wrist back to the starting position. Repeat __________ times. Complete this exercise __________ times per day. STRENGTH  Forearm Supinators   Sit with your right /  left forearm supported on a table, keeping your elbow below shoulder height. Rest your hand over the edge, palm down.  Gently grip a hammer or a soup ladle.  Without moving your elbow, slowly turn your palm and hand upward to a "thumbs-up" position.  Hold this position for __________ seconds. Slowly return to the starting position. Repeat __________ times. Complete this exercise __________ times per day.  STRENGTH  Forearm Pronators   Sit with your right / left forearm supported on a table, keeping your elbow below shoulder height. Rest your hand over the edge, palm up.  Gently grip a hammer or a soup ladle.  Without moving your elbow, slowly turn your palm and hand upward to a "thumbs-up" position.  Hold this position for __________ seconds. Slowly return to the starting position. Repeat __________ times. Complete this exercise __________ times per day.  STRENGTH - Grip  Grasp a tennis ball, a dense sponge, or a large, rolled sock in your hand.  Squeeze as hard as you can, without increasing any pain.  Hold this position for __________ seconds. Release your grip slowly. Repeat __________ times. Complete this exercise __________ times per day.  STRENGTH - Elbow Extensors, Isometric  Stand or sit upright, on a firm surface. Place your right / left arm so that your palm faces your stomach, and it is at the height of your waist.  Place your opposite hand on   the underside of your forearm. Gently push up as your right / left arm resists. Push as hard as you can with both arms, without causing any pain or movement at your right / left elbow. Hold this stationary position for __________ seconds. Gradually release the tension in both arms. Allow your muscles to relax completely before repeating. Document Released: 01/07/2005 Document Revised: 04/01/2011 Document Reviewed: 04/21/2008 [employer] Patient Information 2014 [employer].  

## 2012-09-28 ENCOUNTER — Ambulatory Visit: Payer: BC Managed Care – PPO | Admitting: Sports Medicine

## 2012-10-07 ENCOUNTER — Ambulatory Visit (INDEPENDENT_AMBULATORY_CARE_PROVIDER_SITE_OTHER): Payer: BC Managed Care – PPO | Admitting: Sports Medicine

## 2012-10-07 VITALS — BP 118/82 | Ht 62.0 in | Wt 160.0 lb

## 2012-10-07 DIAGNOSIS — M25529 Pain in unspecified elbow: Secondary | ICD-10-CM

## 2012-10-07 DIAGNOSIS — M7710 Lateral epicondylitis, unspecified elbow: Secondary | ICD-10-CM

## 2012-10-07 DIAGNOSIS — M7711 Lateral epicondylitis, right elbow: Secondary | ICD-10-CM

## 2012-10-07 DIAGNOSIS — M25521 Pain in right elbow: Secondary | ICD-10-CM

## 2012-10-07 MED ORDER — METHYLPREDNISOLONE ACETATE 40 MG/ML IJ SUSP
40.0000 mg | Freq: Once | INTRAMUSCULAR | Status: AC
  Administered 2012-10-07: 40 mg via INTRA_ARTICULAR

## 2012-10-07 NOTE — Progress Notes (Signed)
  Subjective:    Patient ID: Lori Wheeler, female    DOB: [birth year], 47 y.o.   MRN: [id number]  HPI Patient comes in today for followup on right elbow lateral epicondylitis. Still having pain despite several physical therapy treatments. She continues to localize the pain to the lateral elbow with some radiating pain into the forearm. No numbness and tingling. Continues to be worse with wrist related activities such as grip and twisting. She has been wearing her wrist brace intermittently. Symptoms have been present now for about 3 months. Ultrasound at last visit showed changes in the common extensor tendon consistent with tendinitis but no discrete tear was seen.    Review of Systems     Objective:   Physical Exam Well-developed, well-nourished. No acute distress. Awake alert and oriented x3  Right elbow: Full range of motion. No effusion. No soft tissue swelling. There is tenderness to palpation directly over the lateral epicondyle with reproducible pain with resisted ECRB testing. No tenderness over the medial epicondyle. No tenderness to palpation at the radial tunnel. Decreased grip strength secondary to pain. Good radial and ulnar pulses.       Assessment & Plan:  Persistent lateral right elbow pain secondary to lateral epicondylitis  Patient's right elbow was injected today. 1 cc of 40 mg Depo-Medrol and 1 cc of 1% Xylocaine was injected into the common extensor tendon origin. This was done atraumatically and under sterile technique after risks and benefits were explained to the patient including the risk of hypopigmentation. She tolerated the procedure without difficulty. In reviewing her home exercises it sounds like she has been doing them incorrectly. I instructed her on the proper way to do it eccentric strengthening for the common extensor tendon. She will discontinue formal physical therapy and followup with me in 4 weeks. If symptoms persist, I would start with repeating an  ultrasound but may need to consider further diagnostic imaging if surgery is being considered. Alternatively, we could consider a trial of topical nitroglycerin. Patient will call me with questions or concerns in the interim.

## 2012-11-05 ENCOUNTER — Ambulatory Visit: Payer: BC Managed Care – PPO | Admitting: Sports Medicine

## 2012-11-12 ENCOUNTER — Ambulatory Visit: Payer: BC Managed Care – PPO | Admitting: Sports Medicine

## 2012-12-20 ENCOUNTER — Ambulatory Visit (INDEPENDENT_AMBULATORY_CARE_PROVIDER_SITE_OTHER): Payer: BC Managed Care – PPO | Admitting: Emergency Medicine

## 2012-12-20 ENCOUNTER — Ambulatory Visit: Payer: BC Managed Care – PPO

## 2012-12-20 VITALS — BP 126/82 | HR 104 | Temp 99.6°F | Resp 16 | Ht 63.0 in | Wt 168.4 lb

## 2012-12-20 DIAGNOSIS — E049 Nontoxic goiter, unspecified: Secondary | ICD-10-CM

## 2012-12-20 DIAGNOSIS — R05 Cough: Secondary | ICD-10-CM

## 2012-12-20 DIAGNOSIS — J209 Acute bronchitis, unspecified: Secondary | ICD-10-CM

## 2012-12-20 DIAGNOSIS — E010 Iodine-deficiency related diffuse (endemic) goiter: Secondary | ICD-10-CM

## 2012-12-20 DIAGNOSIS — J029 Acute pharyngitis, unspecified: Secondary | ICD-10-CM

## 2012-12-20 LAB — POCT CBC
Granulocyte percent: 80.4 %G — AB (ref 37–80)
MID (cbc): 0.7 (ref 0–0.9)
MPV: 8.7 fL (ref 0–99.8)
POC Granulocyte: 11.1 — AB (ref 2–6.9)
POC MID %: 4.9 %M (ref 0–12)
Platelet Count, POC: 230 10*3/uL (ref 142–424)
RBC: 3.51 M/uL — AB (ref 4.04–5.48)

## 2012-12-20 LAB — POCT RAPID STREP A (OFFICE): Rapid Strep A Screen: NEGATIVE

## 2012-12-20 LAB — POCT INFLUENZA A/B: Influenza A, POC: NEGATIVE

## 2012-12-20 LAB — TSH: TSH: 3.339 u[IU]/mL (ref 0.350–4.500)

## 2012-12-20 MED ORDER — BENZONATATE 100 MG PO CAPS: 100.0000 mg | ORAL_CAPSULE | Freq: Three times a day (TID) | ORAL | Status: DC | PRN

## 2012-12-20 MED ORDER — AZITHROMYCIN 250 MG PO TABS: ORAL_TABLET | ORAL | Status: DC

## 2012-12-20 MED ORDER — HYDROCODONE-HOMATROPINE 5-1.5 MG/5ML PO SYRP: ORAL_SOLUTION | ORAL | Status: DC

## 2012-12-20 NOTE — Progress Notes (Addendum)
Subjective:  This chart was scribed for Lesle Chris, MD by Carl Best, Medical Scribe. This patient was seen in Room 9 and the patient's care was started at 10:17 AM.   Patient ID: Lori Wheeler, female    DOB: 17-Dec-1965, 47 y.o.   MRN: [id number]  HPI HPI Comments: Lori Wheeler is a 47 y.o. female who presents to the [facility] complaining of constant cough, nasal congestion, voice change, and sore throat that started 6 days ago.  The patient state that she flew to [place] to see her nieces on Friday night and started experiencing a sore throat and body aches on Monday.  She states that she slept for 12 hours on Monday and her body aches subsided but her cough, voice change, nasal congestion, and cough remained.  She states that her cough has been dry but yesterday it was productive of yellow sputum.  She states that her cough has worsened in the last 24 hours and made it hard to sleep.  She states that she took Nyquil, cough medicine with Hydrocodone, and cough suppressants last night with no relief to her symptoms.  She states that she had an URI two years ago and was given an inhaler but denies using it for the symptoms that she has been experiencing lately.  She states that she is a non-smoker.  The patient denies obtaining a flu shot.  Past Medical History  Diagnosis Date  . Anxiety   . Allergy    Past Surgical History  Procedure Laterality Date  . Mandible surgery  1991   Family History  Problem Relation Age of Onset  . Diabetes Brother   . Hypertension Brother   . Hyperlipidemia Brother   . Cancer Brother     kidney cancer  . Hyperlipidemia Brother   . Hyperlipidemia Brother    History   Social History  . Marital Status: Married    Spouse Name: N/A    Number of Children: N/A  . Years of Education: N/A   Occupational History  . Not on file.   Social History Main Topics  . Smoking status: Former Smoker -- 1 years    Types: Cigarettes      Quit date: 01/22/1983  . Smokeless tobacco: Not on file  . Alcohol Use: Yes     Comment: beer - 6 pack/week  . Drug Use: Not on file  . Sexual Activity: Not on file   Other Topics Concern  . Not on file   Social History Narrative  . No narrative on file   No Known Allergies  Current outpatient prescriptions:b complex vitamins tablet, Take 1 tablet by mouth daily., Disp: , Rfl: ;  Calcium-Magnesium-Vitamin D (CALCIUM MAGNESIUM PO), Take 1,000 mg by mouth daily., Disp: , Rfl: ;  Multiple Vitamin (MULTIVITAMIN) tablet, Take 1 tablet by mouth daily., Disp: , Rfl: ;  norethindrone-ethinyl estradiol (JUNEL FE,GILDESS FE,LOESTRIN FE) 1-20 MG-MCG tablet, Take 1 tablet by mouth daily., Disp: , Rfl:  PARoxetine (PAXIL) 10 MG tablet, Take 1 tablet (10 mg total) by mouth every morning., Disp: 90 tablet, Rfl: 3;  VITAMIN D, CHOLECALCIFEROL, PO, Take 1,000 Units by mouth daily., Disp: , Rfl:    Review of Systems  HENT: Positive for congestion, sore throat and voice change.   Respiratory: Positive for cough.   All other systems reviewed and are negative.      Objective:  Physical Exam Physical Exam  Nursing note and vitals reviewed. Constitutional: He is  oriented to person, place, and time. He appears well-developed and well-nourished. No distress.  HENT:  Head: Supple.  Thyroid appears symetircally enlarged.  Normocephalic.  Eyes: EOM are normal. Pupils are equal, round, and reactive to light.  Neck: Normal range of motion. Neck supple.  Cardiovascular: Normal rate, regular rhythm and normal heart sounds.  Exam reveals no gallop and no friction rub.   No murmur heard. Pulmonary/Chest: Effort normal and breath sounds normal. No respiratory distress. He has no wheezes. He has no rales.  With deep inspiration, patient has severe cough. Abdominal: Soft.  Neurological: He is alert and oriented to person, place, and time.  Skin: Skin is warm and dry.  Results for orders placed in visit on  12/20/12  POCT RAPID STREP A (OFFICE)      Result Value Range   Rapid Strep A Screen Negative  Negative  POCT INFLUENZA A/B      Result Value Range   Influenza A, POC Negative     Influenza B, POC Negative    POCT CBC      Result Value Range   WBC 13.8 (*) 4.6 - 10.2 K/uL   Lymph, poc 2.0  0.6 - 3.4   POC LYMPH PERCENT 14.7  10 - 50 %L   MID (cbc) 0.7  0 - 0.9   POC MID % 4.9  0 - 12 %M   POC Granulocyte 11.1 (*) 2 - 6.9   Granulocyte percent 80.4 (*) 37 - 80 %G   RBC 3.51 (*) 4.04 - 5.48 M/uL   Hemoglobin 10.5 (*) 12.2 - 16.2 g/dL   HCT, POC 95.6 (*) 21.3 - 47.9 %   MCV 98.5 (*) 80 - 97 fL   MCH, POC 29.9  27 - 31.2 pg   MCHC 30.3 (*) 31.8 - 35.4 g/dL   RDW, POC 08.6     Platelet Count, POC 230  142 - 424 K/uL   MPV 8.7  0 - 99.8 fL   [facility] reading (PRIMARY) by  Dr. Cleta Alberts  no evidence of pneumonia.      Assessment & Plan:  Treat with a Z-Pak Tessalon Perles Hycodan at night as needed I personally performed the services described in this documentation, which was scribed in my presence. The recorded information has been reviewed and is accurate.

## 2012-12-20 NOTE — Patient Instructions (Signed)

## 2013-01-16 ENCOUNTER — Ambulatory Visit (INDEPENDENT_AMBULATORY_CARE_PROVIDER_SITE_OTHER): Payer: BC Managed Care – PPO | Admitting: Internal Medicine

## 2013-01-16 VITALS — BP 134/78 | HR 80 | Temp 98.9°F | Resp 16 | Ht 63.0 in | Wt 163.8 lb

## 2013-01-16 DIAGNOSIS — J45909 Unspecified asthma, uncomplicated: Secondary | ICD-10-CM

## 2013-01-16 DIAGNOSIS — R05 Cough: Secondary | ICD-10-CM

## 2013-01-16 DIAGNOSIS — R059 Cough, unspecified: Secondary | ICD-10-CM

## 2013-01-16 MED ORDER — PREDNISONE 20 MG PO TABS: ORAL_TABLET | ORAL | Status: DC

## 2013-01-16 MED ORDER — PROMETHAZINE-CODEINE 6.25-10 MG/5ML PO SYRP: 5.0000 mL | ORAL_SOLUTION | Freq: Four times a day (QID) | ORAL | Status: DC | PRN

## 2013-01-16 MED ORDER — AMOXICILLIN 875 MG PO TABS: 875.0000 mg | ORAL_TABLET | Freq: Two times a day (BID) | ORAL | Status: DC

## 2013-01-16 NOTE — Progress Notes (Signed)
Subjective:  This chart was scribed for Lori Sia, MD by Carl Best, Medical Scribe. This patient was seen in Room 4 and the patient's care was started at 10:23 AM.   Patient ID: Lori Wheeler, female    DOB: 1965-09-15, 47 y.o.   MRN: [id number]  HPI HPI Comments: Lori Wheeler is a 47 y.o. female who presents to the [facility] complaining of constant dry cough and lightheadedness that started five days ago.  The patient states that she experienced similar symptoms right before Thanksgiving and came to [facility] for treatment.  She states that she was given a Z-pak, Tessalon Perles, and Hycodan for her symptoms.  She states that the [employer] and Hycodan did not alleviate her symptoms.  She states that Nyquil helped her sleep.  She states that since then her symptoms have returned.  She lists postnasal drainage, sinus pressure, and wheezing as associated symptoms.  She denies fever, SOB, and sore throat as associated symptoms.  She denies having any family members with similar symptoms.  Past Medical History  Diagnosis Date  . Anxiety   . Allergy    Past Surgical History  Procedure Laterality Date  . Mandible surgery  1991   Family History  Problem Relation Age of Onset  . Diabetes Brother   . Hypertension Brother   . Hyperlipidemia Brother   . Cancer Brother     kidney cancer  . Hyperlipidemia Brother   . Hyperlipidemia Brother    History   Social History  . Marital Status: Married    Spouse Name: N/A    Number of Children: N/A  . Years of Education: N/A   Occupational History  . Not on file.   Social History Main Topics  . Smoking status: Former Smoker -- 1 years    Types: Cigarettes    Quit date: 01/22/1983  . Smokeless tobacco: Not on file  . Alcohol Use: Yes     Comment: beer - 6 pack/week  . Drug Use: Not on file  . Sexual Activity: Not on file   Other Topics Concern  . Not on file   Social History Narrative  . No  narrative on file   No Known Allergies    Review of Systems  Constitutional: Negative for fever.  HENT: Positive for postnasal drip and sinus pressure. Negative for sore throat.   Respiratory: Positive for wheezing. Negative for shortness of breath.       Objective:  Physical Exam  Nursing note and vitals reviewed. Constitutional: She is oriented to person, place, and time. She appears well-developed and well-nourished.  HENT:  Head: Normocephalic and atraumatic.  Right Ear: External ear normal.  Left Ear: External ear normal.  Mouth/Throat: Oropharynx is clear and moist.  Boggy turbs with purulent d/c  Eyes: Conjunctivae and EOM are normal. Pupils are equal, round, and reactive to light.  Neck: Normal range of motion and phonation normal. Neck supple.  Cardiovascular: Normal rate, regular rhythm, normal heart sounds and intact distal pulses.   No murmur heard. Pulmonary/Chest: Effort normal.  Wheezing bilat w/ forced expiration  Musculoskeletal: Normal range of motion. She exhibits no edema.  Lymphadenopathy:    She has no cervical adenopathy.  Neurological: She is alert and oriented to person, place, and time.  Skin: Skin is warm and dry. No rash noted.  Psychiatric: She has a normal mood and affect. Her behavior is normal.     BP 134/78  Pulse 80  Temp(Src) 98.9 F (37.2 C) (Oral)  Resp 16  Ht 5\' 3"  (1.6 m)  Wt 163 lb 12.8 oz (74.299 kg)  BMI 29.02 kg/m2  SpO2 98%  LMP 11/19/2012    Assessment & Plan:  I have completed the patient encounter in its entirety as documented by the scribe, with editing by me where necessary. Kule Gascoigne P. Merla Riches, M.D. RAD (reactive airway disease)  Cough  ?persistant sinus infection Meds ordered this encounter  Medications  . predniSONE (DELTASONE) 20 MG tablet    Sig: 3/3/2/2/1/1 single daily dose for 6 days    Dispense:  12 tablet    Refill:  0  . promethazine-codeine (PHENERGAN WITH CODEINE) 6.25-10 MG/5ML syrup     Sig: Take 5-10 mLs by mouth every 6 (six) hours as needed for cough.    Dispense:  120 mL    Refill:  0  . amoxicillin (AMOXIL) 875 MG tablet    Sig: Take 1 tablet (875 mg total) by mouth 2 (two) times daily.    Dispense:  20 tablet    Refill:  0

## 2013-03-12 ENCOUNTER — Ambulatory Visit: Payer: BC Managed Care – PPO

## 2013-03-12 ENCOUNTER — Encounter: Payer: Self-pay | Admitting: Family Medicine

## 2013-03-12 ENCOUNTER — Ambulatory Visit (INDEPENDENT_AMBULATORY_CARE_PROVIDER_SITE_OTHER): Payer: BC Managed Care – PPO | Admitting: Family Medicine

## 2013-03-12 VITALS — BP 129/80 | HR 68 | Temp 99.3°F | Resp 16 | Ht 62.75 in | Wt 166.0 lb

## 2013-03-12 DIAGNOSIS — M79675 Pain in left toe(s): Secondary | ICD-10-CM

## 2013-03-12 DIAGNOSIS — S90122A Contusion of left lesser toe(s) without damage to nail, initial encounter: Secondary | ICD-10-CM

## 2013-03-12 DIAGNOSIS — S90129A Contusion of unspecified lesser toe(s) without damage to nail, initial encounter: Secondary | ICD-10-CM

## 2013-03-12 DIAGNOSIS — M79609 Pain in unspecified limb: Secondary | ICD-10-CM

## 2013-03-12 NOTE — Patient Instructions (Signed)
Foot Contusion  A foot contusion is a deep bruise to the foot. Contusions happen when an injury causes bleeding under the skin. Signs of bruising include pain, puffiness (swelling), and discolored skin. The contusion may turn blue, purple, or yellow. HOME CARE  Put ice on the injured area.  Put ice in a plastic bag.  Place a towel between your skin and the bag.  Leave the ice on for 15-20 minutes, 03-04 times a day.  Only take medicines as told by your doctor.  Use an elastic wrap only as told. You may remove the wrap for sleeping, showering, and bathing. Take the wrap off if you lose feeling (numb) in your toes, or they turn blue or cold. Put the wrap on more loosely.  Keep the foot raised (elevated) with pillows.  If your foot hurts, avoid standing or walking.  When your doctor says it is okay to use your foot, start using it slowly. If you have pain, lessen how much you use your foot.  See your doctor as told. GET HELP RIGHT AWAY IF:   You have more redness, puffiness, or pain in your foot.  Your puffiness or pain does not get better with medicine.  You lose feeling in your foot, or you cannot move your toes.  Your foot turns cold or blue.  You have pain when you move your toes.  Your foot feels warm.  Your contusion does not get better in 2 days. MAKE SURE YOU:   Understand these instructions.  Will watch this condition.  Will get help right away if you or your child is not doing well or gets worse. Document Released: 10/17/2007 Document Revised: 07/09/2011 Document Reviewed: 12/11/2010 [facility] Patient Information 2014 [place], [place].    Your xray will be reviewed by a radiologist and an official report will be called to you this weekend. I do not see a fracture; if there is anything abnormal on the xray, I will contact you. You may have soreness in that toe for several weeks.  Arnica Gel or cream is a good natural product for joint pain and bruises. You can  find this at your local pharmacy.

## 2013-03-12 NOTE — Progress Notes (Signed)
Quick Note:  Your recent imaging study (xray of toe on L foot) is normal. ______

## 2013-03-12 NOTE — Progress Notes (Signed)
S:  This 48 y.o. Cauc female presents w/ foot rauma after stubbing her L 3rd toe on bedpost 5 days ago. Pain= "5" on 1-10 scale. Less pain if she stays off feet. She is able to bear weight.  Patient Active Problem List   Diagnosis Date Noted  . Right elbow pain 08/28/2012  . Lateral epicondylitis of right elbow 08/28/2012  . Anxiety state, unspecified 08/20/2012  . MUSCLE STRAIN, HAMSTRING MUSCLE 06/06/2006   PMHx, Surg Hx, Soc and Fam Hx reviewed.  MEDICATIONS reconciled.  ROS: Noncontributory.  O: Filed Vitals:   03/12/13 1611  BP: 129/80  Pulse: 68  Temp: 99.3 F (37.4 C)  Resp: 16   GEN: In NAD; WN,WD. MS: L foot- Midfoot normal appearance; metatarsals NT w/ palpation; 3rd toe distal aspect is discolored around nail but nail is intact. Minimal tenderness. Neurovascular intact.   [facility] reading (PRIMARY) by  Dr. Leward Quan: L foot- 3rd toe normal appearance w/o fracture or dislocation.  A/P: Toe pain, left - Expect gradual resolution of discoloration and discomfort. Plan: DG Toe 3rd Left  Contusion of third toe of left foot- Advised Arnica Gel or Cream OTC for pain and bruising.

## 2013-08-20 ENCOUNTER — Other Ambulatory Visit: Payer: Self-pay

## 2013-08-20 NOTE — Telephone Encounter (Signed)
Pt needs refill on PARoxetine (PAXIL) 10 MG tablet [[id number]] only has 10 pills left.   Pt wanted to know if Dr. Leward Quan could refill prescription until appt.  Pt scheduled CPE on 09/23/2013 @ 2:00 pm with Dr. Leward Quan.

## 2013-08-23 NOTE — Telephone Encounter (Signed)
Pended please advise.  

## 2013-08-25 MED ORDER — PAROXETINE HCL 10 MG PO TABS: 10.0000 mg | ORAL_TABLET | ORAL | Status: DC

## 2013-08-25 NOTE — Telephone Encounter (Signed)
Paxil refilled for additional 4 months (in case pt does not make it to appt in Sept).

## 2013-08-31 ENCOUNTER — Other Ambulatory Visit: Payer: Self-pay | Admitting: Family Medicine

## 2013-09-21 ENCOUNTER — Ambulatory Visit (INDEPENDENT_AMBULATORY_CARE_PROVIDER_SITE_OTHER): Payer: BC Managed Care – PPO | Admitting: Family Medicine

## 2013-09-21 ENCOUNTER — Encounter: Payer: Self-pay | Admitting: Family Medicine

## 2013-09-21 VITALS — BP 120/74 | HR 69 | Temp 99.0°F | Resp 16 | Ht 63.0 in | Wt 164.0 lb

## 2013-09-21 DIAGNOSIS — J3489 Other specified disorders of nose and nasal sinuses: Secondary | ICD-10-CM

## 2013-09-21 DIAGNOSIS — F411 Generalized anxiety disorder: Secondary | ICD-10-CM

## 2013-09-21 MED ORDER — CLINDAMYCIN PHOSPHATE 1 % EX FOAM: 1.0000 "application " | Freq: Two times a day (BID) | CUTANEOUS | Status: DC

## 2013-09-21 MED ORDER — PAROXETINE HCL 10 MG PO TABS: 10.0000 mg | ORAL_TABLET | ORAL | Status: DC

## 2013-09-21 NOTE — Progress Notes (Signed)
S:  This 48 y.o. Cauc female is here for medication refills. Chronic anxiety is well controlled on Paroxetine 10 mg 1 tablet daily. She has no adverse medication effects. Pt is exercising regularly and has a recurrence of acne; she requests refill of topical clindamycin foam. Sleep hygiene is good; pt has no anorexia, abnormal weight loss, vision disturbances, CP or palpitations, SOB, GI upset/nausea, HA, dizziness, weakness, confusion, concentration difficulties or abnormal behavior.  Patient Active Problem List   Diagnosis Date Noted  . Right elbow pain 08/28/2012  . Lateral epicondylitis of right elbow 08/28/2012  . Anxiety state, unspecified 08/20/2012  . MUSCLE STRAIN, HAMSTRING MUSCLE 06/06/2006   Prior to Admission medications   Medication Sig Start Date End Date Taking? Authorizing Provider  b complex vitamins tablet Take 1 tablet by mouth daily.   Yes Historical Provider, MD  Calcium-Magnesium-Vitamin D (CALCIUM MAGNESIUM PO) Take 1,000 mg by mouth daily.   Yes Historical Provider, MD  Multiple Vitamin (MULTIVITAMIN) tablet Take 1 tablet by mouth daily.   Yes Historical Provider, MD  norethindrone-ethinyl estradiol (JUNEL FE,GILDESS FE,LOESTRIN FE) 1-20 MG-MCG tablet Take 1 tablet by mouth daily.   Yes Historical Provider, MD  PARoxetine (PAXIL) 10 MG tablet Take 1 tablet (10 mg total) by mouth every morning.   Yes Barton Fanny, MD  VITAMIN D, CHOLECALCIFEROL, PO Take 1,000 Units by mouth daily.   Yes Historical Provider, MD  Clindamycin Phosphate foam Apply 1 application topically 2 (two) times daily.    Barton Fanny, MD  HYDROcodone-homatropine [facility]) 5-1.5 MG/5ML syrup 1 teaspoon every 6-8 hours for cough. Medication causes drowsiness take mainly at night 12/20/12   Darlyne Russian, MD   History   Social History  . Marital Status: Married    Spouse Name: N/A    Number of Children: N/A  . Years of Education: N/A   Occupational History  . Not on file.   Social  History Main Topics  . Smoking status: Former Smoker -- 1 years    Types: Cigarettes    Quit date: 01/22/1983  . Smokeless tobacco: Not on file  . Alcohol Use: Yes     Comment: beer - 6 pack/week  . Drug Use: Not on file  . Sexual Activity: Not on file   Other Topics Concern  . Not on file   Social History Narrative  . No narrative on file    Family History  Problem Relation Age of Onset  . Diabetes Brother   . Hypertension Brother   . Hyperlipidemia Brother   . Cancer Brother     kidney cancer  . Hyperlipidemia Brother   . Hyperlipidemia Brother     ROS: As per HPI.  O: Filed Vitals:   09/21/13 1405  BP: 120/74  Pulse: 69  Temp: 99 F (37.2 C)  Resp: 16   GEN: In NAD; WN,WD. HENT: [place]/AT; EOMI w/ clear conj/sclerae. Otherwise unremarkable. COR: RRR. LUNGS: Normal resp rate and effort. SKIN: W&D; intact w/o erythema, rash or pallor. NEURO: A&O x 3; CNs intact. Nonfocal.  A/P: Anxiety state, unspecified- Stable on Paroxetine 10 mg daily.  Nasal dryness- Try AYR saline nasal gel.

## 2013-09-23 ENCOUNTER — Encounter: Payer: BC Managed Care – PPO | Admitting: Family Medicine

## 2013-10-22 ENCOUNTER — Encounter: Payer: BC Managed Care – PPO | Admitting: Family Medicine

## 2014-01-26 ENCOUNTER — Encounter: Payer: BC Managed Care – PPO | Admitting: Family Medicine

## 2014-02-24 ENCOUNTER — Encounter: Payer: Self-pay | Admitting: Family Medicine

## 2014-02-24 ENCOUNTER — Ambulatory Visit (INDEPENDENT_AMBULATORY_CARE_PROVIDER_SITE_OTHER): Payer: BC Managed Care – PPO | Admitting: Family Medicine

## 2014-02-24 VITALS — BP 126/82 | HR 71 | Temp 98.6°F | Resp 16 | Ht 63.0 in | Wt 164.0 lb

## 2014-02-24 DIAGNOSIS — Z833 Family history of diabetes mellitus: Secondary | ICD-10-CM

## 2014-02-24 DIAGNOSIS — Z0000 Encounter for general adult medical examination without abnormal findings: Secondary | ICD-10-CM

## 2014-02-24 DIAGNOSIS — Z8349 Family history of other endocrine, nutritional and metabolic diseases: Secondary | ICD-10-CM

## 2014-02-24 LAB — COMPLETE METABOLIC PANEL WITH GFR
ALBUMIN: 4.2 g/dL (ref 3.5–5.2)
ALK PHOS: 46 U/L (ref 39–117)
ALT: 27 U/L (ref 0–35)
AST: 26 U/L (ref 0–37)
BILIRUBIN TOTAL: 0.5 mg/dL (ref 0.2–1.2)
BUN: 10 mg/dL (ref 6–23)
CO2: 26 meq/L (ref 19–32)
CREATININE: 0.76 mg/dL (ref 0.50–1.10)
Calcium: 9.8 mg/dL (ref 8.4–10.5)
Chloride: 100 mEq/L (ref 96–112)
GFR, Est African American: >89 mL/min
GFR, Est Non African American: >89 mL/min
GLUCOSE: 89 mg/dL (ref 70–99)
POTASSIUM: 4.1 meq/L (ref 3.5–5.3)
SODIUM: 135 meq/L (ref 135–145)
Total Protein: 7.4 g/dL (ref 6.0–8.3)

## 2014-02-24 LAB — CBC WITH DIFFERENTIAL/PLATELET
Basophils Absolute: 0.1 10*3/uL (ref 0.0–0.1)
Basophils Relative: 1 % (ref 0–1)
EOS PCT: 2 % (ref 0–5)
Eosinophils Absolute: 0.2 10*3/uL (ref 0.0–0.7)
HCT: 40.2 % (ref 36.0–46.0)
Hemoglobin: 13.1 g/dL (ref 12.0–15.0)
LYMPHS ABS: 2.1 10*3/uL (ref 0.7–4.0)
Lymphocytes Relative: 28 % (ref 12–46)
MCH: 30.5 pg (ref 26.0–34.0)
MCHC: 32.6 g/dL (ref 30.0–36.0)
MCV: 93.5 fL (ref 78.0–100.0)
MPV: 9.4 fL (ref 8.6–12.4)
Monocytes Absolute: 0.5 10*3/uL (ref 0.1–1.0)
Monocytes Relative: 7 % (ref 3–12)
Neutro Abs: 4.7 10*3/uL (ref 1.7–7.7)
Neutrophils Relative %: 62 % (ref 43–77)
Platelets: 297 10*3/uL (ref 150–400)
RBC: 4.3 MIL/uL (ref 3.87–5.11)
RDW: 12.7 % (ref 11.5–15.5)
WBC: 7.6 10*3/uL (ref 4.0–10.5)

## 2014-02-24 LAB — LIPID PANEL
Cholesterol: 248 mg/dL — ABNORMAL HIGH (ref 0–200)
HDL: 67 mg/dL (ref >39)
LDL Cholesterol: 143 mg/dL — ABNORMAL HIGH (ref 0–99)
TRIGLYCERIDES: 188 mg/dL — AB (ref <150)
Total CHOL/HDL Ratio: 3.7 Ratio
VLDL: 38 mg/dL (ref 0–40)

## 2014-02-24 LAB — TSH: TSH: 1.742 u[IU]/mL (ref 0.350–4.500)

## 2014-02-24 LAB — T4, FREE: Free T4: 0.9 ng/dL (ref 0.80–1.80)

## 2014-02-24 LAB — VITAMIN D 25 HYDROXY (VIT D DEFICIENCY, FRACTURES): Vit D, 25-Hydroxy: 25 ng/mL — ABNORMAL LOW (ref 30–100)

## 2014-02-24 NOTE — Progress Notes (Deleted)
   Subjective:    Patient ID: Lori Wheeler, female    DOB: Aug 29, 1965, 49 y.o.   MRN: [id number]  HPI    Review of Systems     Objective:   Physical Exam        Assessment & Plan:

## 2014-02-24 NOTE — Patient Instructions (Signed)
Keeping You Healthy  Get These Tests 1. Blood Pressure- Have your blood pressure checked once a year by your health care provider.  Normal blood pressure is 120/80. 2. Weight- Have your body mass index (BMI) calculated to screen for obesity.  BMI is measure of body fat based on height and weight.  You can also calculate your own BMI at GravelBags.it. 3. Cholesterol- Have your cholesterol checked every 5 years starting at age 47 then yearly starting at age 74. 33. Chlamydia, HIV, and other sexually transmitted diseases- Get screened every year until age 66, then within three months of each new sexual provider. 5. Pap Smear- Every 1-3 years; discuss with your health care provider. 6. Mammogram- Every year starting at age 84  Take these medicines  Calcium with Vitamin D-Your body needs 1200 mg of Calcium each day and [phone] IU of Vitamin D daily.  Your body can only absorb 500 mg of Calcium at a time so Calcium must be taken in 2 or 3 divided doses throughout the day.  Multivitamin with folic acid- Once daily if it is possible for you to become pregnant.  Get these Immunizations  Tetanus shot- Every 10 years. Next Tetanus due in 2024.  Flu shot-Every year. You do not get this vaccine.  Take these steps 1. Do not smoke-Your healthcare provider can help you quit.  For tips on how to quit go to www.smokefree.gov or call 1-800 QUITNOW. 2. Be physically active- Exercise 5 days a week for at least 30 minutes.  If you are not already physically active, start slow and gradually work up to 30 minutes of moderate physical activity.  Examples of moderate activity include walking briskly, dancing, swimming, bicycling, etc. 3. Breast Cancer- A self breast exam every month is important for early detection of breast cancer.  For more information and instruction on self breast exams, ask your healthcare provider or https://www.patel.info/. 4. Eat a healthy diet- Eat a  variety of healthy foods such as fruits, vegetables, whole grains, low fat milk, low fat cheeses, yogurt, lean meats, poultry and fish, beans, nuts, tofu, etc.  For more information go to www. Thenutritionsource.org 5. Drink alcohol in moderation- Limit alcohol intake to one drink or less per day. Never drink and drive. 6. Depression- Your emotional health is as important as your physical health.  If you're feeling down or losing interest in things you normally enjoy please talk to your healthcare provider about being screened for depression. 7. Dental visit- Brush and floss your teeth twice daily; visit your dentist twice a year. 8. Eye doctor- Get an eye exam at least every 2 years. 9. Helmet use- Always wear a helmet when riding a bicycle, motorcycle, rollerblading or skateboarding. 29. Safe sex- If you may be exposed to sexually transmitted infections, use a condom. 11. Seat belts- Seat belts can save your live; always wear one. 12. Smoke/Carbon Monoxide detectors- These detectors need to be installed on the appropriate level of your home. Replace batteries at least once a year. 13. Skin cancer- When out in the sun please cover up and use sunscreen 15 SPF or higher. 14. Violence- If anyone is threatening or hurting you, please tell your healthcare provider.       Mediterranean Diet  Why follow it? Research shows. . Those who follow the Mediterranean diet have a reduced risk of heart disease  . The diet is associated with a reduced incidence of Parkinson's and Alzheimer's diseases . People following the diet may have  longer life expectancies and lower rates of chronic diseases  . The Dietary Guidelines for Americans recommends the Mediterranean diet as an eating plan to promote health and prevent disease  What Is the Mediterranean Diet?  . Healthy eating plan based on typical foods and recipes of Mediterranean-style cooking . The diet is primarily a plant based diet; these foods should make  up a majority of meals   Starches - Plant based foods should make up a majority of meals - They are an important sources of vitamins, minerals, energy, antioxidants, and fiber - Choose whole grains, foods high in fiber and minimally processed items  - Typical grain sources include wheat, oats, barley, corn, brown rice, bulgar, farro, millet, polenta, couscous  - Various types of beans include chickpeas, lentils, fava beans, black beans, white beans   Fruits  Veggies - Large quantities of antioxidant rich fruits & veggies; 6 or more servings  - Vegetables can be eaten raw or lightly drizzled with oil and cooked  - Vegetables common to the traditional Mediterranean Diet include: artichokes, arugula, beets, broccoli, brussel sprouts, cabbage, carrots, celery, collard greens, cucumbers, eggplant, kale, leeks, lemons, lettuce, mushrooms, okra, onions, peas, peppers, potatoes, pumpkin, radishes, rutabaga, shallots, spinach, sweet potatoes, turnips, zucchini - Fruits common to the Mediterranean Diet include: apples, apricots, avocados, cherries, clementines, dates, figs, grapefruits, grapes, melons, nectarines, oranges, peaches, pears, pomegranates, strawberries, tangerines  Fats - Replace butter and margarine with healthy oils, such as olive oil, canola oil, and tahini  - Limit nuts to no more than a handful a day  - Nuts include walnuts, almonds, pecans, pistachios, pine nuts  - Limit or avoid candied, honey roasted or heavily salted nuts - Olives are central to the [employer] - can be eaten whole or used in a variety of dishes   Meats Protein - Limiting red meat: no more than a few times a month - When eating red meat: choose lean cuts and keep the portion to the size of deck of cards - Eggs: approx. 0 to 4 times a week  - Fish and lean poultry: at least 2 a week  - Healthy protein sources include, chicken, [place], lean beef, lamb - Increase intake of seafood such as tuna, salmon, trout,  mackerel, shrimp, scallops - Avoid or limit high fat processed meats such as sausage and bacon  Dairy - Include moderate amounts of low fat dairy products  - Focus on healthy dairy such as fat free yogurt, skim milk, low or reduced fat cheese - Limit dairy products higher in fat such as whole or 2% milk, cheese, ice cream  Alcohol - Moderate amounts of red wine is ok  - No more than 5 oz daily for women (all ages) and men older than age 44  - No more than 10 oz of wine daily for men younger than 10  Other - Limit sweets and other desserts  - Use herbs and spices instead of salt to flavor foods  - Herbs and spices common to the traditional Mediterranean Diet include: basil, bay leaves, chives, cloves, cumin, fennel, garlic, lavender, marjoram, mint, oregano, parsley, pepper, rosemary, sage, savory, sumac, tarragon, thyme   It's not just a diet, it's a lifestyle:  . The Mediterranean diet includes lifestyle factors typical of those in the region  . Foods, drinks and meals are best eaten with others and savored . Daily physical activity is important for overall good health . This could be strenuous exercise like running and  aerobics . This could also be more leisurely activities such as walking, housework, yard-work, or taking the stairs . Moderation is the key; a balanced and healthy diet accommodates most foods and drinks . Consider portion sizes and frequency of consumption of certain foods   Meal Ideas & Options:  . Breakfast:  o Whole wheat toast or whole wheat English muffins with peanut butter & hard boiled egg o Steel cut oats topped with apples & cinnamon and skim milk  o Fresh fruit: banana, strawberries, melon, berries, peaches  o Smoothies: strawberries, bananas, greek yogurt, peanut butter o Low fat greek yogurt with blueberries and granola  o Egg white omelet with spinach and mushrooms o Breakfast couscous: whole wheat couscous, apricots, skim milk, cranberries  . Sandwiches:   o Hummus and grilled vegetables (peppers, zucchini, squash) on whole wheat bread   o Grilled chicken on whole wheat pita with lettuce, tomatoes, cucumbers or tzatziki  o Tuna salad on whole wheat bread: tuna salad made with greek yogurt, olives, red peppers, capers, green onions o Garlic rosemary lamb pita: lamb sauted with garlic, rosemary, salt & pepper; add lettuce, cucumber, greek yogurt to pita - flavor with lemon juice and black pepper  . Seafood:  o Mediterranean grilled salmon, seasoned with garlic, basil, parsley, lemon juice and black pepper o Shrimp, lemon, and spinach whole-grain pasta salad made with low fat greek yogurt  o Seared scallops with lemon orzo  o Seared tuna steaks seasoned salt, pepper, coriander topped with tomato mixture of olives, tomatoes, olive oil, minced garlic, parsley, green onions and cappers  . Meats:  o Herbed greek chicken salad with kalamata olives, cucumber, feta  o Red bell peppers stuffed with spinach, bulgur, lean ground beef (or lentils) & topped with feta   o Kebabs: skewers of chicken, tomatoes, onions, zucchini, squash  o [place] burgers: made with red onions, mint, dill, lemon juice, feta cheese topped with roasted red peppers . Vegetarian o Cucumber salad: cucumbers, artichoke hearts, celery, red onion, feta cheese, tossed in olive oil & lemon juice  o Hummus and whole grain pita points with a greek salad (lettuce, tomato, feta, olives, cucumbers, red onion) o Lentil soup with celery, carrots made with vegetable broth, garlic, salt and pepper  o Tabouli salad: parsley, bulgur, mint, scallions, cucumbers, tomato, radishes, lemon juice, olive oil, salt and pepper. o

## 2014-02-24 NOTE — Progress Notes (Signed)
Subjective:    Patient ID: Lori Wheeler, female    DOB: 22-Apr-1965, 49 y.o.   MRN: [id number]  HPI  This 49 y.o. Female is here for CPE; she enjoys good health and has no complaints. GYN exam is with a specialty practice; MMG is current.   HCM: PAP/MMG as noted above.           IMM- Current; pt declines Flu vaccine.           Vision- Current.           Dental- Current; recent crowns placed this week.  Patient Active Problem List   Diagnosis Date Noted  . Right elbow pain 08/28/2012  . Lateral epicondylitis of right elbow 08/28/2012  . Anxiety state, unspecified 08/20/2012  . MUSCLE STRAIN, HAMSTRING MUSCLE 06/06/2006    Prior to Admission medications   Medication Sig Start Date End Date Taking? Authorizing Provider  b complex vitamins tablet Take 1 tablet by mouth daily.   Yes Historical Provider, MD  Calcium-Magnesium-Vitamin D (CALCIUM MAGNESIUM PO) Take 1,000 mg by mouth daily.   Yes Historical Provider, MD  Clindamycin Phosphate foam Apply 1 application topically 2 (two) times daily. 09/21/13  Yes Barton Fanny, MD  Multiple Vitamin (MULTIVITAMIN) tablet Take 1 tablet by mouth daily.   Yes Historical Provider, MD  norethindrone-ethinyl estradiol (JUNEL FE,GILDESS FE,LOESTRIN FE) 1-20 MG-MCG tablet Take 1 tablet by mouth daily.   Yes Historical Provider, MD  PARoxetine (PAXIL) 10 MG tablet Take 1 tablet (10 mg total) by mouth every morning. 09/21/13  Yes Barton Fanny, MD  VITAMIN D, CHOLECALCIFEROL, PO Take 1,000 Units by mouth daily.   Yes Historical Provider, MD    Past Surgical History  Procedure Laterality Date  . Mandible surgery  1991    History   Social History  . Marital Status: Married    Spouse Name: N/A    Number of Children: N/A  . Years of Education: N/A   Occupational History  . Dance movement psychotherapist    Social History Main Topics  . Smoking status: Former Smoker -- 1 years    Types: Cigarettes    Quit date: 01/22/1983  . Smokeless  tobacco: Not on file  . Alcohol Use: Yes     Comment: beer - 6 pack/week  . Drug Use: Not on file  . Sexual Activity: Yes   Other Topics Concern  . Not on file   Social History Narrative    Family History  Problem Relation Age of Onset  . Diabetes Brother   . Hypertension Brother   . Hyperlipidemia Brother   . Cancer Brother     kidney cancer  . Hyperlipidemia Brother   . Hyperlipidemia Brother     Review of Systems  Constitutional: Negative.   HENT: Negative.   Eyes: Negative.   Respiratory: Negative.   Cardiovascular: Negative.   Gastrointestinal: Negative.   Endocrine: Negative.   Genitourinary: Negative.   Musculoskeletal: Negative.   Skin: Negative.   Allergic/Immunologic: Negative.   Neurological: Negative.   Hematological: Negative.   Psychiatric/Behavioral: Negative.       Objective:   Physical Exam  Constitutional: She is oriented to person, place, and time. Vital signs are normal. She appears well-developed and well-nourished.  HENT:  Head: Normocephalic and atraumatic.  Right Ear: Hearing, tympanic membrane, external ear and ear canal normal.  Left Ear: Hearing, tympanic membrane, external ear and ear canal normal.  Nose: Nose normal. No nasal deformity or septal  deviation.  Mouth/Throat: Uvula is midline and mucous membranes are normal. No oral lesions. Normal dentition.  Eyes: Conjunctivae, EOM and lids are normal. Pupils are equal, round, and reactive to light. No scleral icterus.  Fundoscopic exam:      The right eye shows red reflex.       The left eye shows red reflex.  Neck: Trachea normal, normal range of motion, full passive range of motion without pain and phonation normal. Neck supple. No spinous process tenderness and no muscular tenderness present. No thyroid mass and no thyromegaly present.  Cardiovascular: Normal rate, regular rhythm, S1 normal, S2 normal, normal heart sounds, intact distal pulses and normal pulses.  PMI is not  displaced.  Exam reveals no gallop and no friction rub.   No murmur heard. Pulmonary/Chest: Effort normal and breath sounds normal. No respiratory distress.  Abdominal: Soft. Normal appearance, normal aorta and bowel sounds are normal. There is no hepatosplenomegaly. There is no tenderness. There is no rigidity, no guarding and no CVA tenderness.  Genitourinary:  Deferred.  Musculoskeletal:       Cervical back: Normal.       Thoracic back: Normal.       Lumbar back: Normal.  Remainder of exam unremarkable.  Lymphadenopathy:       Head (right side): No submental, no submandibular, no tonsillar, no preauricular, no posterior auricular and no occipital adenopathy present.       Head (left side): No submental, no submandibular, no tonsillar, no preauricular, no posterior auricular and no occipital adenopathy present.    She has no cervical adenopathy.       Right: No supraclavicular adenopathy present.       Left: No supraclavicular adenopathy present.  Neurological: She is alert and oriented to person, place, and time. She has normal strength and normal reflexes. She displays no atrophy. No cranial nerve deficit or sensory deficit. She exhibits normal muscle tone. Coordination and gait normal.  Skin: Skin is warm, dry and intact. No ecchymosis, no lesion and no rash noted. No cyanosis or erythema. No pallor. Nails show no clubbing.  Psychiatric: She has a normal mood and affect. Her speech is normal and behavior is normal. Judgment and thought content normal. Cognition and memory are normal.  Nursing note and vitals reviewed.      Assessment & Plan:  Well woman exam (no gynecological exam) - Plan: Vitamin D, 25-hydroxy, CBC with Differential/Platelet, Lipid panel  Family history of thyroid disease - Plan: TSH, T4, Free, COMPLETE METABOLIC PANEL WITH GFR  Family history of diabetes mellitus in brother - Plan: Lipid panel, COMPLETE METABOLIC PANEL WITH GFR

## 2014-02-27 NOTE — Progress Notes (Signed)
Quick Note:  Please advise pt regarding following labs... Vitamin D is below normal; increase your supplement to 2 tablets daily. Thyroid function is normal. Complete blood counts are normal. Metabolic profile (salts, sugar, kidney and liver function) is normal.  Lipid panel demonstrates high total and LDL cholesterol levels as well as above-normal triglycerides. The paperwork that you received at the end of your recent visit contains information about The MEDITERRANEAN DIET. This is a great guide for healthy eating and will help lower your lipid values. The HDL ("good") cholesterol is high; this is a good thing. Get a Fish Oil supplement 1200 mg and take it daily to help improve your lipid values. Take the fish oil capsule separate from all other pills and capsules.  Copy to pt. ______

## 2014-02-28 ENCOUNTER — Encounter: Payer: Self-pay | Admitting: Family Medicine

## 2014-10-11 ENCOUNTER — Telehealth: Payer: Self-pay

## 2014-10-11 MED ORDER — PAROXETINE HCL 10 MG PO TABS: 10.0000 mg | ORAL_TABLET | ORAL | Status: DC

## 2014-10-11 NOTE — Telephone Encounter (Signed)
Pt states her PAXIL 10 MG was suppose to be for 1 year and she got it in February of this year from Dr Leward Quan. Please call [phone]     [facility] [address]

## 2014-10-11 NOTE — Telephone Encounter (Signed)
Dr Leward Quan did say in her notes from 02/2014 CPE that pt can return in a year. I will RF the Paxil through next Feb. Called pt to advise.

## 2015-02-17 ENCOUNTER — Encounter: Payer: Self-pay | Admitting: Family Medicine

## 2015-02-17 ENCOUNTER — Ambulatory Visit (INDEPENDENT_AMBULATORY_CARE_PROVIDER_SITE_OTHER): Payer: BC Managed Care – PPO | Admitting: Family Medicine

## 2015-02-17 VITALS — BP 120/86 | HR 86 | Temp 98.1°F | Resp 16 | Ht 63.25 in | Wt 162.6 lb

## 2015-02-17 DIAGNOSIS — F411 Generalized anxiety disorder: Secondary | ICD-10-CM | POA: Insufficient documentation

## 2015-02-17 DIAGNOSIS — Z114 Encounter for screening for human immunodeficiency virus [HIV]: Secondary | ICD-10-CM | POA: Diagnosis not present

## 2015-02-17 DIAGNOSIS — Z0000 Encounter for general adult medical examination without abnormal findings: Secondary | ICD-10-CM | POA: Diagnosis not present

## 2015-02-17 DIAGNOSIS — Z131 Encounter for screening for diabetes mellitus: Secondary | ICD-10-CM | POA: Diagnosis not present

## 2015-02-17 DIAGNOSIS — E785 Hyperlipidemia, unspecified: Secondary | ICD-10-CM

## 2015-02-17 LAB — CBC WITH DIFFERENTIAL/PLATELET
Basophils Absolute: 0.1 10*3/uL (ref 0.0–0.1)
Basophils Relative: 1 % (ref 0–1)
Eosinophils Absolute: 0.3 10*3/uL (ref 0.0–0.7)
Eosinophils Relative: 5 % (ref 0–5)
HEMATOCRIT: 38.8 % (ref 36.0–46.0)
HEMOGLOBIN: 12.6 g/dL (ref 12.0–15.0)
LYMPHS ABS: 1.6 10*3/uL (ref 0.7–4.0)
LYMPHS PCT: 27 % (ref 12–46)
MCH: 30.1 pg (ref 26.0–34.0)
MCHC: 32.5 g/dL (ref 30.0–36.0)
MCV: 92.8 fL (ref 78.0–100.0)
MONO ABS: 0.4 10*3/uL (ref 0.1–1.0)
MONOS PCT: 6 % (ref 3–12)
MPV: 9.5 fL (ref 8.6–12.4)
NEUTROS ABS: 3.7 10*3/uL (ref 1.7–7.7)
Neutrophils Relative %: 61 % (ref 43–77)
Platelets: 329 10*3/uL (ref 150–400)
RBC: 4.18 MIL/uL (ref 3.87–5.11)
RDW: 12.9 % (ref 11.5–15.5)
WBC: 6 10*3/uL (ref 4.0–10.5)

## 2015-02-17 LAB — TSH: TSH: 1.785 u[IU]/mL (ref 0.350–4.500)

## 2015-02-17 LAB — COMPREHENSIVE METABOLIC PANEL
ALBUMIN: 4.1 g/dL (ref 3.6–5.1)
ALT: 22 U/L (ref 6–29)
AST: 23 U/L (ref 10–35)
Alkaline Phosphatase: 51 U/L (ref 33–115)
BILIRUBIN TOTAL: 0.5 mg/dL (ref 0.2–1.2)
BUN: 7 mg/dL (ref 7–25)
CALCIUM: 9.2 mg/dL (ref 8.6–10.2)
CHLORIDE: 103 mmol/L (ref 98–110)
CO2: 25 mmol/L (ref 20–31)
Creat: 0.74 mg/dL (ref 0.50–1.10)
Glucose, Bld: 84 mg/dL (ref 65–99)
Potassium: 4.5 mmol/L (ref 3.5–5.3)
Sodium: 141 mmol/L (ref 135–146)
Total Protein: 7.1 g/dL (ref 6.1–8.1)

## 2015-02-17 LAB — LIPID PANEL
CHOL/HDL RATIO: 3.5 ratio (ref <=5.0)
CHOLESTEROL: 244 mg/dL — AB (ref 125–200)
HDL: 70 mg/dL (ref >=46)
LDL Cholesterol: 139 mg/dL — ABNORMAL HIGH (ref <130)
Triglycerides: 176 mg/dL — ABNORMAL HIGH (ref <150)
VLDL: 35 mg/dL — ABNORMAL HIGH (ref <30)

## 2015-02-17 LAB — VITAMIN B12: VITAMIN B 12: 611 pg/mL (ref 211–911)

## 2015-02-17 LAB — POCT URINALYSIS DIP (MANUAL ENTRY)
BILIRUBIN UA: NEGATIVE
BILIRUBIN UA: NEGATIVE
Glucose, UA: NEGATIVE
LEUKOCYTES UA: NEGATIVE
Nitrite, UA: NEGATIVE
Protein Ur, POC: NEGATIVE
Spec Grav, UA: 1.02
Urobilinogen, UA: 0.2
pH, UA: 6

## 2015-02-17 LAB — HEMOGLOBIN A1C
Hgb A1c MFr Bld: 5.2 % (ref <5.7)
MEAN PLASMA GLUCOSE: 103 mg/dL (ref <117)

## 2015-02-17 LAB — HIV ANTIBODY (ROUTINE TESTING W REFLEX): HIV 1&2 Ab, 4th Generation: NONREACTIVE

## 2015-02-17 MED ORDER — PAROXETINE HCL 10 MG PO TABS: 10.0000 mg | ORAL_TABLET | ORAL | Status: DC

## 2015-02-17 NOTE — Progress Notes (Signed)
Subjective:    Patient ID: Lori Wheeler, female    DOB: 12/09/1965, 50 y.o.   MRN: [id number]  02/17/2015  Medication Refill and Anxiety   HPI This 50 y.o. female presents for anxiety follow-up.  Ten yeas on Paxil 10mg  daily.  Considered coming off of medication.  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  Work is going well. Admission to behavioral health ten years ago.  Two admissions.   Treadmill.  2.  L nare dryness:  Trying nettie pot.    Gynecologist: sees Lori Wheeler at OB[facility]N.   Review of Systems  Constitutional: Negative for fever, chills, diaphoresis and fatigue.  Eyes: Negative for visual disturbance.  Respiratory: Negative for cough and shortness of breath.   Cardiovascular: Negative for chest pain, palpitations and leg swelling.  Gastrointestinal: Negative for nausea, vomiting, abdominal pain, diarrhea and constipation.  Endocrine: Negative for cold intolerance, heat intolerance, polydipsia, polyphagia and polyuria.  Neurological: Negative for dizziness, tremors, seizures, syncope, facial asymmetry, speech difficulty, weakness, light-headedness, numbness and headaches.    Past Medical History  Diagnosis Date  . Anxiety   . Allergy     Claritin daily   Past Surgical History  Procedure Laterality Date  . Mandible surgery  1991  . Admission      Behavioral Health admission; 2 weeks.   No Known Allergies Current Outpatient Prescriptions  Medication Sig Dispense Refill  . b complex vitamins tablet Take 1 tablet by mouth daily.    . Calcium-Magnesium-Vitamin D (CALCIUM MAGNESIUM PO) Take 1,000 mg by mouth daily.    . Clindamycin Phosphate foam Apply 1 application topically 2 (two) times daily. 100 g 11  . Multiple Vitamin (MULTIVITAMIN) tablet Take 1 tablet by mouth daily.    . norethindrone-ethinyl estradiol (JUNEL FE,GILDESS FE,LOESTRIN FE) 1-20 MG-MCG tablet Take 1 tablet by mouth daily.    Marland Kitchen PARoxetine (PAXIL) 10 MG tablet Take 1 tablet (10 mg total) by mouth  every morning. 90 tablet 1  . VITAMIN D, CHOLECALCIFEROL, PO Take 1,000 Units by mouth daily.     No current facility-administered medications for this visit.   Social History   Social History  . Marital Status: Married    Spouse Name: N/A  . Number of Children: N/A  . Years of Education: N/A   Occupational History  . Dance movement psychotherapist    Social History Main Topics  . Smoking status: Former Smoker -- 1 years    Types: Cigarettes    Quit date: 01/22/1983  . Smokeless tobacco: Not on file  . Alcohol Use: Yes     Comment: beer - 6 pack/week  . Drug Use: Not on file  . Sexual Activity: Yes   Other Topics Concern  . Not on file   Social History Narrative   Marital status:  Married x 27 years      Children: none; one dog      Lives: with husband, dog.       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 Diabetes Brother   . Hypertension Brother   . Hyperlipidemia Brother   . Cancer Brother     kidney cancer  . Hyperlipidemia Brother   . Hypertension Brother   . Hyperlipidemia Brother   . Hypertension  Brother   . Cancer Father        Objective:    BP 120/86 mmHg  Pulse 86  Temp(Src) 98.1 F (36.7 C) (Oral)  Resp 16  Ht 5' 3.25" (1.607 m)  Wt 162 lb 9.6 oz (73.755 kg)  BMI 28.56 kg/m2  SpO2 95%  LMP 02/12/2015 Physical Exam  Constitutional: She is oriented to person, place, and time. She appears well-developed and well-nourished. No distress.  HENT:  Head: Normocephalic and atraumatic.  Right Ear: External ear normal.  Left Ear: External ear normal.  Nose: Nose normal.  Mouth/Throat: Oropharynx is clear and moist.  Eyes: Conjunctivae and EOM are normal. Pupils are equal, round, and reactive to light.  Neck: Normal range of motion. Neck supple. Carotid bruit is not present. No thyromegaly present.  Cardiovascular: Normal rate,  regular rhythm, normal heart sounds and intact distal pulses.  Exam reveals no gallop and no friction rub.   No murmur heard. Pulmonary/Chest: Effort normal and breath sounds normal. She has no wheezes. She has no rales.  Abdominal: Soft. Bowel sounds are normal. She exhibits no distension and no mass. There is no tenderness. There is no rebound and no guarding.  Lymphadenopathy:    She has no cervical adenopathy.  Neurological: She is alert and oriented to person, place, and time. No cranial nerve deficit.  Skin: Skin is warm and dry. No rash noted. She is not diaphoretic. No erythema. No pallor.  Psychiatric: She has a normal mood and affect. Her behavior is normal.   Results for orders placed or performed in visit on 02/17/15  POCT urinalysis dipstick  Result Value Ref Range   Color, UA yellow yellow   Clarity, UA cloudy (A) clear   Glucose, UA negative negative   Bilirubin, UA negative negative   Ketones, POC UA negative negative   Spec Grav, UA 1.020    Blood, UA moderate (A) negative   pH, UA 6.0    Protein Ur, POC negative negative   Urobilinogen, UA 0.2    Nitrite, UA Negative Negative   Leukocytes, UA Negative Negative       Assessment & Plan:   1. Routine physical examination   2. Anxiety state   3. Generalized anxiety disorder   4. Hyperlipidemia   5. Screening for diabetes mellitus   6. Screening for HIV (human immunodeficiency virus)     Orders Placed This Encounter  Procedures  . CBC with Differential/Platelet  . Comprehensive metabolic panel    Order Specific Question:  Has the patient fasted?    Answer:  Yes  . Hemoglobin A1c  . Lipid panel    Order Specific Question:  Has the patient fasted?    Answer:  Yes  . TSH  . Vitamin B12  . VITAMIN D 25 Hydroxy (Vit-D Deficiency, Fractures)  . HIV antibody  . POCT urinalysis dipstick   Meds ordered this encounter  Medications  . PARoxetine (PAXIL) 10 MG tablet    Sig: Take 1 tablet (10 mg total) by  mouth every morning.    Dispense:  90 tablet    Refill:  1    Return in about 6 months (around 08/17/2015) for complete physical examiniation.    Mare Ludtke Elayne Guerin, M.D. [facility] [address] [place], [place]  [address] [phone] phone [phone] fax

## 2015-02-19 LAB — VITAMIN D 25 HYDROXY (VIT D DEFICIENCY, FRACTURES): Vit D, 25-Hydroxy: 26 ng/mL — ABNORMAL LOW (ref 30–100)

## 2015-02-27 ENCOUNTER — Encounter: Payer: Self-pay | Admitting: Family Medicine

## 2015-08-14 ENCOUNTER — Other Ambulatory Visit: Payer: Self-pay | Admitting: Family Medicine

## 2015-09-05 ENCOUNTER — Encounter: Payer: BC Managed Care – PPO | Admitting: Family Medicine

## 2015-09-19 ENCOUNTER — Ambulatory Visit (INDEPENDENT_AMBULATORY_CARE_PROVIDER_SITE_OTHER): Payer: BC Managed Care – PPO | Admitting: Family Medicine

## 2015-09-19 ENCOUNTER — Encounter: Payer: Self-pay | Admitting: Family Medicine

## 2015-09-19 VITALS — BP 110/80 | HR 66 | Temp 98.5°F | Resp 16 | Ht 62.5 in | Wt 159.0 lb

## 2015-09-19 DIAGNOSIS — E7800 Pure hypercholesterolemia, unspecified: Secondary | ICD-10-CM

## 2015-09-19 DIAGNOSIS — R3129 Other microscopic hematuria: Secondary | ICD-10-CM

## 2015-09-19 DIAGNOSIS — E559 Vitamin D deficiency, unspecified: Secondary | ICD-10-CM | POA: Diagnosis not present

## 2015-09-19 DIAGNOSIS — F411 Generalized anxiety disorder: Secondary | ICD-10-CM | POA: Diagnosis not present

## 2015-09-19 LAB — POCT URINALYSIS DIP (MANUAL ENTRY)
Bilirubin, UA: NEGATIVE
Glucose, UA: NEGATIVE
Ketones, POC UA: NEGATIVE
Leukocytes, UA: NEGATIVE
Nitrite, UA: NEGATIVE
PH UA: 7
PROTEIN UA: NEGATIVE
RBC UA: NEGATIVE
SPEC GRAV UA: 1.01
UROBILINOGEN UA: 0.2

## 2015-09-19 LAB — POC MICROSCOPIC URINALYSIS (UMFC): MUCUS RE: ABSENT

## 2015-09-19 MED ORDER — PAROXETINE HCL 10 MG PO TABS: 10.0000 mg | ORAL_TABLET | ORAL | 11 refills | Status: DC

## 2015-09-19 NOTE — Patient Instructions (Addendum)
IF you received an x-ray today, you will receive an invoice from [facility]. Please contact [facility] at [phone] with questions or concerns regarding your invoice.   IF you received labwork today, you will receive an invoice from [employer]. Please contact [facility] at [phone] with questions or concerns regarding your invoice.   Our billing staff will not be able to assist you with questions regarding bills from these companies.  You will be contacted with the lab results as soon as they are available. The fastest way to get your results is to activate your My Chart account. Instructions are located on the last page of this paperwork. If you have not heard from [place] regarding the results in 2 weeks, please contact this office.     High Cholesterol High cholesterol refers to having a high level of cholesterol in your blood. Cholesterol is a white, waxy, fat-like protein that your body needs in small amounts. Your liver makes all the cholesterol you need. Excess cholesterol comes from the food you eat. Cholesterol travels in your bloodstream through your blood vessels. If you have high cholesterol, deposits (plaque) may build up on the walls of your blood vessels. This makes the arteries narrower and stiffer. Plaque increases your risk of heart attack and stroke. Work with your health care provider to keep your cholesterol levels in a healthy range. RISK FACTORS Several things can make you more likely to have high cholesterol. These include:   Eating foods high in animal fat (saturated fat) or cholesterol.  Being overweight.  Not getting enough exercise.  Having a family history of high cholesterol. SIGNS AND SYMPTOMS High cholesterol does not cause symptoms. DIAGNOSIS  Your health care provider can do a blood test to check whether you have high cholesterol. If you are older than 20, your health care provider may check your  cholesterol every 4-6 years. You may be checked more often if you already have high cholesterol or other risk factors for heart disease. The blood test for cholesterol measures the following:  Bad cholesterol (LDL cholesterol). This is the type of cholesterol that causes heart disease. This number should be less than 100.  Good cholesterol (HDL cholesterol). This type helps protect against heart disease. A healthy level of HDL cholesterol is 60 or higher.  Total cholesterol. This is the combined number of LDL cholesterol and HDL cholesterol. A healthy number is less than 200. TREATMENT  High cholesterol can be treated with diet changes, lifestyle changes, and medicine.   Diet changes may include eating more whole grains, fruits, vegetables, nuts, and fish. You may also have to cut back on red meat and foods with a lot of added sugar.  Lifestyle changes may include getting at least 40 minutes of aerobic exercise three times a week. Aerobic exercises include walking, biking, and swimming. Aerobic exercise along with a healthy diet can help you maintain a healthy weight. Lifestyle changes may also include quitting smoking.  If diet and lifestyle changes are not enough to lower your cholesterol, your health care provider may prescribe a statin medicine. This medicine has been shown to lower cholesterol and also lower the risk of heart disease. HOME CARE INSTRUCTIONS  Only take over-the-counter or prescription medicines as directed by your health care provider.   Follow a healthy diet as directed by your health care provider. For instance:   Eat chicken (without skin), fish, veal, shellfish, ground [place] breast, and round or loin cuts of  red meat.  Do not eat fried foods and fatty meats, such as hot dogs and salami.   Eat plenty of fruits, such as apples.   Eat plenty of vegetables, such as broccoli, potatoes, and carrots.   Eat beans, peas, and lentils.   Eat grains, such as barley,  rice, couscous, and bulgur wheat.   Eat pasta without cream sauces.   Use skim or nonfat milk and low-fat or nonfat yogurt and cheeses. Do not eat or drink whole milk, cream, ice cream, egg yolks, and hard cheeses.   Do not eat stick margarine or tub margarines that contain trans fats (also called partially hydrogenated oils).   Do not eat cakes, cookies, crackers, or other baked goods that contain trans fats.   Do not eat saturated tropical oils, such as coconut and palm oil.   Exercise as directed by your health care provider. Increase your activity level with activities such as gardening or walking.   Keep all follow-up appointments.  SEEK MEDICAL CARE IF:  You are struggling to maintain a healthy diet or weight.  You need help starting an exercise program.  You need help to stop smoking. SEEK IMMEDIATE MEDICAL CARE IF:  You have chest pain.  You have trouble breathing.   This information is not intended to replace advice given to you by your health care provider. Make sure you discuss any questions you have with your health care provider.   Document Released: 01/07/2005 Document Revised: 01/28/2014 Document Reviewed: 10/30/2012 Elsevier Interactive Patient Education [employer].

## 2015-09-19 NOTE — Progress Notes (Signed)
By signing my name below, I, Mesha Guinyard, attest that this documentation has been prepared under the direction and in the presence of [employer]  Electronically Signed: Verlee Monte, Medical Scribe. 09/19/2015. 5:19 PM.  Subjective:    Patient ID: Lori Wheeler, female    DOB: [birth year], 50 y.o.   MRN: [id number]  09/19/2015  Medication Refill (Paxil 10 mg, pt declined flu shot)  HPI  HPI Comments: Lori Wheeler is a 50 y.o. female with a PMHx of anxiety who presents to the [facility] for medication refill. I last saw her in Jan for anxiety and her Vit D was low, her cholesterol was up, and there was blood in her urine. Pt isn't sure is she was on her menses at that time but is willing to give a sample today.  Anxiety: Pt states she's been good. Pt has been taking Paxil for 10 years. Pt doesn't want to get off of Paxil since when she was on a lot of medication when she was initially dx with anxiety and she likes being on a "butterfly dose" of Paxil. Pt states work is a little stressful, but she's "rolling with the punches". Pt denies experiencing any negative side effects while on Paxil. Pt no longer sees her counselor. Pt gets anxiety when she feels like she isn't catching on at work. When she has a bad mood it's a mix between depression, and anxiety, but mainly anxiety. Pt denies suicidal ideation, or thoughts of self harm. Depression screen [facility] 2/9 09/19/2015 02/17/2015 02/24/2014 09/21/2013  Decreased Interest 0 0 0 0  Down, Depressed, Hopeless 0 0 0 0  PHQ - 2 Score 0 0 0 0   Cholesterol: Pt mentons she's a vegetarian, but she was heavy on the cheese, but has cut back since. Pt mentions all of the men in her family is on cholesterol medication. Pt exercises.  Breast CA Screening: Pt last mammogram was done this year.  Review of Systems  Constitutional: Negative for chills, diaphoresis, fatigue and fever.  Eyes: Negative for visual disturbance.    Respiratory: Negative for cough and shortness of breath.   Cardiovascular: Negative for chest pain, palpitations and leg swelling.  Gastrointestinal: Negative for abdominal pain, constipation, diarrhea, nausea and vomiting.  Endocrine: Negative for cold intolerance, heat intolerance, polydipsia, polyphagia and polyuria.  Neurological: Negative for dizziness, tremors, seizures, syncope, facial asymmetry, speech difficulty, weakness, light-headedness, numbness and headaches.  Psychiatric/Behavioral: Negative for dysphoric mood, self-injury and suicidal ideas. The patient is nervous/anxious (controlled with Paxil).    Past Medical History:  Diagnosis Date  . Allergy    Claritin daily  . Anxiety    Past Surgical History:  Procedure Laterality Date  . Admission     Behavioral Health admission; 2 weeks.  Marland Kitchen MANDIBLE SURGERY  1991   No Known Allergies Current Outpatient Prescriptions  Medication Sig Dispense Refill  . b complex vitamins tablet Take 1 tablet by mouth daily.    . Calcium-Magnesium-Vitamin D (CALCIUM MAGNESIUM PO) Take 1,000 mg by mouth daily.    . Clindamycin Phosphate foam Apply 1 application topically 2 (two) times daily. 100 g 11  . Multiple Vitamin (MULTIVITAMIN) tablet Take 1 tablet by mouth daily.    . norethindrone-ethinyl estradiol (JUNEL FE,GILDESS FE,LOESTRIN FE) 1-20 MG-MCG tablet Take 1 tablet by mouth daily.    Marland Kitchen PARoxetine (PAXIL) 10 MG tablet Take 1 tablet (10 mg total) by mouth every morning. 30 tablet 11  . VITAMIN D,  CHOLECALCIFEROL, PO Take 1,000 Units by mouth daily.     No current facility-administered medications for this visit.    Social History   Social History  . Marital status: Married    Spouse name: N/A  . Number of children: N/A  . Years of education: N/A   Occupational History  . Dance movement psychotherapist    Social History Main Topics  . Smoking status: Former Smoker    Years: 1.00    Types: Cigarettes    Quit date: 01/22/1983  .  Smokeless tobacco: Never Used  . Alcohol use Yes     Comment: beer - 6 pack/week  . Drug use: Unknown  . Sexual activity: Yes   Other Topics Concern  . Not on file   Social History Narrative   Marital status:  Married x 27 years      Children: none; one dog      Lives: with husband, dog.       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 Diabetes Brother   . Hypertension Brother   . Hyperlipidemia Brother   . Cancer Brother     kidney cancer  . Hyperlipidemia Brother   . Hypertension Brother   . Hyperlipidemia Brother   . Hypertension Brother   . Cancer Father    Objective:    BP 110/80 (BP Location: Right Arm, Patient Position: Sitting, Cuff Size: Normal)   Pulse 66   Temp 98.5 F (36.9 C) (Oral)   Resp 16   Ht 5' 2.5" (1.588 m)   Wt 159 lb (72.1 kg)   LMP 09/03/2015   SpO2 96%   BMI 28.62 kg/m  Physical Exam  Constitutional: She is oriented to person, place, and time. She appears well-developed and well-nourished. No distress.  HENT:  Head: Normocephalic and atraumatic.  Right Ear: External ear normal.  Left Ear: External ear normal.  Nose: Nose normal.  Mouth/Throat: Oropharynx is clear and moist.  Eyes: Conjunctivae and EOM are normal. Pupils are equal, round, and reactive to light.  Neck: Normal range of motion. Neck supple. Carotid bruit is not present. No thyromegaly present.  Cardiovascular: Normal rate, regular rhythm, normal heart sounds and intact distal pulses.  Exam reveals no gallop and no friction rub.   No murmur heard. Pulmonary/Chest: Effort normal and breath sounds normal. No respiratory distress. She has no wheezes. She has no rales.  Abdominal: Soft. Bowel sounds are normal. She exhibits no distension and no mass. There is no tenderness. There is no rebound and no guarding.  Lymphadenopathy:    She has no cervical  adenopathy.  Neurological: She is alert and oriented to person, place, and time. No cranial nerve deficit.  Skin: Skin is warm and dry. No rash noted. She is not diaphoretic. No erythema. No pallor.  Psychiatric: She has a normal mood and affect. Her behavior is normal.  Nursing note and vitals reviewed.  Results for orders placed or performed in visit on 09/19/15  POCT urinalysis dipstick  Result Value Ref Range   Color, UA yellow yellow   Clarity, UA clear clear   Glucose, UA negative negative   Bilirubin, UA negative negative   Ketones, POC UA negative negative   Spec Grav, UA 1.010  Blood, UA negative negative   pH, UA 7.0    Protein Ur, POC negative negative   Urobilinogen, UA 0.2    Nitrite, UA Negative Negative   Leukocytes, UA Negative Negative  POCT Microscopic Urinalysis ([facility])  Result Value Ref Range   WBC,UR,HPF,POC None None WBC/hpf   RBC,UR,HPF,POC None None RBC/hpf   Bacteria None None, Too numerous to count   Mucus Absent Absent   Epithelial Cells, UR Per Microscopy None None, Too numerous to count cells/hpf   Assessment & Plan:   1. Anxiety state   2. Hematuria, microscopic   3. Pure hypercholesterolemia   4. Vitamin D deficiency     Orders Placed This Encounter  Procedures  . POCT urinalysis dipstick  . POCT Microscopic Urinalysis ([facility])   Meds ordered this encounter  Medications  . PARoxetine (PAXIL) 10 MG tablet    Sig: Take 1 tablet (10 mg total) by mouth every morning.    Dispense:  30 tablet    Refill:  11    PATIENT NEEDS OFFICE VISIT FOR ADDITIONAL REFILLS    Return in about 1 year (around 09/18/2016) for complete physical examiniation.  I personally performed the services described in this documentation, which was scribed in my presence. The recorded information has been reviewed and considered.  Mckinnon Glick Elayne Guerin, M.D. [facility] [address] [place], [place]  [address] [phone] phone [phone] fax

## 2015-12-19 ENCOUNTER — Encounter: Payer: Self-pay | Admitting: Family Medicine

## 2015-12-19 ENCOUNTER — Ambulatory Visit (INDEPENDENT_AMBULATORY_CARE_PROVIDER_SITE_OTHER): Payer: BC Managed Care – PPO | Admitting: Family Medicine

## 2015-12-19 VITALS — BP 152/88 | HR 66 | Temp 98.2°F | Ht 62.5 in | Wt 163.4 lb

## 2015-12-19 DIAGNOSIS — J301 Allergic rhinitis due to pollen: Secondary | ICD-10-CM | POA: Diagnosis not present

## 2015-12-19 DIAGNOSIS — R42 Dizziness and giddiness: Secondary | ICD-10-CM | POA: Diagnosis not present

## 2015-12-19 DIAGNOSIS — Z808 Family history of malignant neoplasm of other organs or systems: Secondary | ICD-10-CM

## 2015-12-19 MED ORDER — MECLIZINE HCL 25 MG PO TABS: 25.0000 mg | ORAL_TABLET | Freq: Three times a day (TID) | ORAL | 0 refills | Status: DC | PRN

## 2015-12-19 MED ORDER — FLUTICASONE PROPIONATE 50 MCG/ACT NA SUSP: 2.0000 | Freq: Every day | NASAL | 12 refills | Status: DC

## 2015-12-19 NOTE — Patient Instructions (Addendum)
   IF you received an x-ray today, you will receive an invoice from [facility]. Please contact [facility] at [phone] with questions or concerns regarding your invoice.   IF you received labwork today, you will receive an invoice from [facility]. Please contact [facility] at [phone] with questions or concerns regarding your invoice.   Our billing staff will not be able to assist you with questions regarding bills from these companies.  You will be contacted with the lab results as soon as they are available. The fastest way to get your results is to activate your My Chart account. Instructions are located on the last page of this paperwork. If you have not heard from us regarding the results in 2 weeks, please contact this office.     Epley Maneuver Self-Care WHAT IS THE EPLEY MANEUVER? The Epley maneuver is an exercise you can do to relieve symptoms of benign paroxysmal positional vertigo (BPPV). This condition is often just referred to as vertigo. BPPV is caused by the movement of tiny crystals (canaliths) inside your inner ear. The accumulation and movement of canaliths in your inner ear causes a sudden spinning sensation (vertigo) when you move your head to certain positions. Vertigo usually lasts about 30 seconds. BPPV usually occurs in just one ear. If you get vertigo when you lie on your left side, you probably have BPPV in your left ear. Your health care provider can tell you which ear is involved.  BPPV may be caused by a head injury. Many people older than 50 get BPPV for unknown reasons. If you have been diagnosed with BPPV, your health care provider may teach you how to do this maneuver. BPPV is not life threatening (benign) and usually goes away in time.  WHEN SHOULD I PERFORM THE EPLEY MANEUVER? You can do this maneuver at home whenever you have symptoms of vertigo. You may do the Epley maneuver up to 3 times a day until your  symptoms of vertigo go away. HOW SHOULD I DO THE EPLEY MANEUVER? 1. Sit on the edge of a bed or table with your back straight. Your legs should be extended or hanging over the edge of the bed or table.  2. Turn your head halfway toward the affected ear.  3. Lie backward quickly with your head turned until you are lying flat on your back. You may want to position a pillow under your shoulders.  4. Hold this position for 30 seconds. You may experience an attack of vertigo. This is normal. Hold this position until the vertigo stops. 5. Then turn your head to the opposite direction until your unaffected ear is facing the floor.  6. Hold this position for 30 seconds. You may experience an attack of vertigo. This is normal. Hold this position until the vertigo stops. 7. Now turn your whole body to the same side as your head. Hold for another 30 seconds.  8. You can then sit back up. ARE THERE RISKS TO THIS MANEUVER? In some cases, you may have other symptoms (such as changes in your vision, weakness, or numbness). If you have these symptoms, stop doing the maneuver and call your health care provider. Even if doing these maneuvers relieves your vertigo, you may still have dizziness. Dizziness is the sensation of light-headedness but without the sensation of movement. Even though the Epley maneuver may relieve your vertigo, it is possible that your symptoms will return within 5 years. WHAT SHOULD I DO AFTER THIS   After doing the Epley maneuver, you can return to your normal activities. Ask your doctor if there is anything you should do at home to prevent vertigo. This may include:  Sleeping with two or more pillows to keep your head elevated.  Not sleeping on the side of your affected ear.  Getting up slowly from bed.  Avoiding sudden movements during the day.  Avoiding extreme head movement, like looking up or bending over.  Wearing a cervical collar to prevent sudden head  movements. WHAT SHOULD I DO IF MY SYMPTOMS GET WORSE? Call your health care provider if your vertigo gets worse. Call your provider right way if you have other symptoms, including:   Nausea.  Vomiting.  Headache.  Weakness.  Numbness.  Vision changes. This information is not intended to replace advice given to you by your health care provider. Make sure you discuss any questions you have with your health care provider. Document Released: 01/12/2013 Document Reviewed: 01/12/2013 Elsevier Interactive Patient Education  2017 [employer].

## 2015-12-19 NOTE — Progress Notes (Signed)
Subjective:    Patient ID: Lori Wheeler, female    DOB: 12/18/1965, 50 y.o.   MRN: [id number]  12/19/2015  Dizziness   HPI This 50 y.o. female presents for evaluation of dizziness.  Two days ago, developed acute onset of dizziness upon standing up. Then cleared up.  Onset one week ago.  Primarily with head movement or laying down.  Last night, reached over reached for phone with acute onset of dizziness. Longest episode 5-10 seconds.  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  Father died of brain tumor at 81.  Looked up with acute onset of dizziness; looked up again and did not get dizzy.  Friend suggested allergies; mild congestion.  A llittle pressure in sinuses.  No fever/chills/sewats  No sore throat or ear pain.  No hearin gloss; no tinnitus.  No numbness or tingling.  No headache.  No nausea; no vomiting.  No blurred vision. No fine motor issues.  No new medications; has not stopped medications. Occurring under ten times per day. Going a little slower. Avoiding jerky movements. During the day, minimal symptoms.  Occurs when on couch or in bed, recurs.  Nothing while driving. Father with brain tumor; suffered a syncopal event; had two brain tumors.     Review of Systems  Constitutional: Negative for chills, diaphoresis, fatigue and fever.  HENT: Positive for congestion, postnasal drip and rhinorrhea. Negative for ear pain, hearing loss, sinus pain, sinus pressure, tinnitus, trouble swallowing and voice change.   Eyes: Negative for photophobia, redness and visual disturbance.  Respiratory: Negative for cough and shortness of breath.   Cardiovascular: Negative for chest pain, palpitations and leg swelling.  Gastrointestinal: Negative for abdominal pain, constipation, diarrhea, nausea and vomiting.  Endocrine: Negative for cold intolerance, heat intolerance, polydipsia, polyphagia and polyuria.  Neurological: Positive for dizziness. Negative for tremors, seizures, syncope, facial asymmetry,  speech difficulty, weakness, light-headedness, numbness and headaches.  Psychiatric/Behavioral: Negative for confusion, decreased concentration and dysphoric mood.    Past Medical History:  Diagnosis Date  . Allergy    Claritin daily  . Anxiety    Past Surgical History:  Procedure Laterality Date  . Admission     Behavioral Health admission; 2 weeks.  Marland Kitchen MANDIBLE SURGERY  1991   No Known Allergies  Social History   Social History  . Marital status: Married    Spouse name: N/A  . Number of children: N/A  . Years of education: N/A   Occupational History  . Dance movement psychotherapist    Social History Main Topics  . Smoking status: Former Smoker    Years: 1.00    Types: Cigarettes    Quit date: 01/22/1983  . Smokeless tobacco: Never Used  . Alcohol use Yes     Comment: beer - 6 pack/week  . Drug use: Unknown  . Sexual activity: Yes   Other Topics Concern  . Not on file   Social History Narrative   Marital status:  Married x 27 years      Children: none; one dog      Lives: with husband, dog.       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 Diabetes Brother   . Hypertension Brother   . Hyperlipidemia Brother   .  Cancer Brother     kidney cancer  . Hyperlipidemia Brother   . Hypertension Brother   . Hyperlipidemia Brother   . Hypertension Brother   . Cancer Father        Objective:    BP (!) 152/88 (BP Location: Right Arm, Patient Position: Sitting, Cuff Size: Small)   Pulse 66   Temp 98.2 F (36.8 C) (Oral)   Ht 5' 2.5" (1.588 m)   Wt 163 lb 6.4 oz (74.1 kg)   LMP 12/17/2015   SpO2 99%   BMI 29.41 kg/m  Physical Exam  Constitutional: She is oriented to person, place, and time. She appears well-developed and well-nourished. No distress.  HENT:  Head: Normocephalic and atraumatic.  Right Ear: Tympanic membrane, external ear  and ear canal normal.  Left Ear: Tympanic membrane, external ear and ear canal normal.  Nose: Nose normal.  Mouth/Throat: Uvula is midline, oropharynx is clear and moist and mucous membranes are normal.  Eyes: Conjunctivae and EOM are normal. Pupils are equal, round, and reactive to light.  Neck: Normal range of motion. Neck supple. Carotid bruit is not present. No thyromegaly present.  Cardiovascular: Normal rate, regular rhythm, normal heart sounds and intact distal pulses.  Exam reveals no gallop and no friction rub.   No murmur heard. Pulmonary/Chest: Effort normal and breath sounds normal. She has no wheezes. She has no rales.  Abdominal: Soft. Bowel sounds are normal. She exhibits no distension and no mass. There is no tenderness. There is no rebound and no guarding.  Lymphadenopathy:    She has no cervical adenopathy.  Neurological: She is alert and oriented to person, place, and time. No cranial nerve deficit. She exhibits normal muscle tone. Coordination normal.  Marye Round + to the R with nystagmus appreciated.  Skin: Skin is warm and dry. No rash noted. She is not diaphoretic. No erythema. No pallor.  Psychiatric: She has a normal mood and affect. Her behavior is normal. Judgment and thought content normal.   Results for orders placed or performed in visit on 12/19/15  CBC with Differential/Platelet  Result Value Ref Range   WBC 8.0 3.8 - 10.8 K/uL   RBC 4.23 3.80 - 5.10 MIL/uL   Hemoglobin 12.9 11.7 - 15.5 g/dL   HCT 38.9 35.0 - 45.0 %   MCV 92.0 80.0 - 100.0 fL   MCH 30.5 27.0 - 33.0 pg   MCHC 33.2 32.0 - 36.0 g/dL   RDW 12.9 11.0 - 15.0 %   Platelets 324 140 - 400 K/uL   MPV 10.4 7.5 - 12.5 fL   Neutro Abs 4,240 1,500 - 7,800 cells/uL   Lymphs Abs 2,960 850 - 3,900 cells/uL   Monocytes Absolute 640 200 - 950 cells/uL   Eosinophils Absolute 160 15 - 500 cells/uL   Basophils Absolute 0 0 - 200 cells/uL   Neutrophils Relative % 53 %   Lymphocytes Relative 37 %    Monocytes Relative 8 %   Eosinophils Relative 2 %   Basophils Relative 0 %   Smear Review Criteria for review not met   Comprehensive metabolic panel  Result Value Ref Range   Sodium 137 135 - 146 mmol/L   Potassium 3.9 3.5 - 5.3 mmol/L   Chloride 103 98 - 110 mmol/L   CO2 24 20 - 31 mmol/L   Glucose, Bld 80 65 - 99 mg/dL   BUN 9 7 - 25 mg/dL   Creat 0.77 0.50 - 1.10 mg/dL   Total  Bilirubin 0.3 0.2 - 1.2 mg/dL   Alkaline Phosphatase 49 33 - 115 U/L   AST 24 10 - 35 U/L   ALT 20 6 - 29 U/L   Total Protein 7.2 6.1 - 8.1 g/dL   Albumin 4.4 3.6 - 5.1 g/dL   Calcium 9.3 8.6 - 10.2 mg/dL   No data found.      Assessment & Plan:   1. Dizziness and giddiness   2. Family history of brain cancer   3. Acute seasonal allergic rhinitis due to pollen    -New; consistent with BPPV.  Due to family history of brain tumor/cancer in father, refer for MRI brain. -recommend starting Flonase daily; also continue oral antihistamine. -provide with Eply maneuvers to perform daily. -RTC for acute worsening. -if no improvement in 1-2 weeks, refer to ENT/[facility]. -rx for Meclizine provided to take qhs.   Orders Placed This Encounter  Procedures  . MR Brain Wo Contrast    Standing Status:   Future    Standing Expiration Date:   02/17/2017    Order Specific Question:   Reason for Exam (SYMPTOM  OR DIAGNOSIS REQUIRED)    Answer:   dizziness; father died of brain tumor at age 38    Order Specific Question:   Preferred imaging location?    Answer:   [facility] (table limit-500 lbs)    Order Specific Question:   What is the patient's sedation requirement?    Answer:   No Sedation    Order Specific Question:   Does the patient have a pacemaker or implanted devices?    Answer:   No  . CBC with Differential/Platelet  . Comprehensive metabolic panel   Meds ordered this encounter  Medications  . meclizine (MEDI-MECLIZINE) 25 MG tablet    Sig: Take 1 tablet (25 mg total) by mouth 3 (three)  times daily as needed for dizziness.    Dispense:  30 tablet    Refill:  0  . fluticasone (FLONASE) 50 MCG/ACT nasal spray    Sig: Place 2 sprays into both nostrils daily.    Dispense:  16 g    Refill:  12    No Follow-up on file.   Olia Hinderliter Elayne Guerin, M.D. [facility] [address] [place], [place]  [address] [phone] phone [phone] fax

## 2015-12-20 LAB — CBC WITH DIFFERENTIAL/PLATELET
BASOS ABS: 0 {cells}/uL (ref 0–200)
BASOS PCT: 0 %
EOS ABS: 160 {cells}/uL (ref 15–500)
Eosinophils Relative: 2 %
HEMATOCRIT: 38.9 % (ref 35.0–45.0)
Hemoglobin: 12.9 g/dL (ref 11.7–15.5)
LYMPHS PCT: 37 %
Lymphs Abs: 2960 cells/uL (ref 850–3900)
MCH: 30.5 pg (ref 27.0–33.0)
MCHC: 33.2 g/dL (ref 32.0–36.0)
MCV: 92 fL (ref 80.0–100.0)
MONO ABS: 640 {cells}/uL (ref 200–950)
MONOS PCT: 8 %
MPV: 10.4 fL (ref 7.5–12.5)
Neutro Abs: 4240 cells/uL (ref 1500–7800)
Neutrophils Relative %: 53 %
PLATELETS: 324 10*3/uL (ref 140–400)
RBC: 4.23 MIL/uL (ref 3.80–5.10)
RDW: 12.9 % (ref 11.0–15.0)
WBC: 8 10*3/uL (ref 3.8–10.8)

## 2015-12-20 LAB — COMPREHENSIVE METABOLIC PANEL
ALK PHOS: 49 U/L (ref 33–115)
ALT: 20 U/L (ref 6–29)
AST: 24 U/L (ref 10–35)
Albumin: 4.4 g/dL (ref 3.6–5.1)
BUN: 9 mg/dL (ref 7–25)
CALCIUM: 9.3 mg/dL (ref 8.6–10.2)
CHLORIDE: 103 mmol/L (ref 98–110)
CO2: 24 mmol/L (ref 20–31)
Creat: 0.77 mg/dL (ref 0.50–1.10)
Glucose, Bld: 80 mg/dL (ref 65–99)
POTASSIUM: 3.9 mmol/L (ref 3.5–5.3)
Sodium: 137 mmol/L (ref 135–146)
TOTAL PROTEIN: 7.2 g/dL (ref 6.1–8.1)
Total Bilirubin: 0.3 mg/dL (ref 0.2–1.2)

## 2015-12-25 ENCOUNTER — Telehealth: Payer: Self-pay

## 2015-12-25 NOTE — Telephone Encounter (Signed)
Left VM with patient updating on status of MRI. Insurance is still reviewing the claim. Once we receive authorization, I will get it scheduled for her. Thanks!

## 2015-12-26 ENCOUNTER — Telehealth: Payer: Self-pay

## 2015-12-26 NOTE — Telephone Encounter (Signed)
12/21/15 mri denied by insurance  L/m today to start a review/appeal [id number] [phone] Paperwork is in the nurse bin

## 2015-12-27 NOTE — Telephone Encounter (Signed)
Faxed in [facility] notes to Appeals for courtesy review. Pending.

## 2015-12-28 NOTE — Telephone Encounter (Signed)
Received VM from [employer] w/approval for pt's MR Brain w/wo contrast at [place]. It sounded like the approval number is [id number]. An approval letter is supposed to be faxed to [place]. Referrals, it looks like you originally asked for the appeal so will forward back to you.

## 2016-01-01 ENCOUNTER — Telehealth: Payer: Self-pay

## 2016-01-01 NOTE — Telephone Encounter (Signed)
PATIENT STATES SHE SAW DR. Tamala Julian A COUPLE OF WEEKS AGO FOR DIZZINESS. DR. Tamala Julian TOLD HER TO CALL BACK IF THE DIZZINESS DID NOT START TO GET BETTER SO THAT SHE COULD HAVE HER SEE A SPECIALIST. PATIENT SAID IT WAS HAPPENING IN THE MORNINGS, BUT NOW IT HAPPENS ALL DAY WHEN SHE IS MOVING AROUND. SHE HAS TRIED THE EXERCISES, HOWEVER, THEY ARE NOT HELPING EITHER. [place] TO GO AHEAD AND REFER HER TO AN ENT SPECIALIST. BEST PHONE [phone] (CELL)  [place]

## 2016-01-08 ENCOUNTER — Telehealth: Payer: Self-pay | Admitting: Emergency Medicine

## 2016-01-08 DIAGNOSIS — Z87898 Personal history of other specified conditions: Secondary | ICD-10-CM

## 2016-01-08 NOTE — Telephone Encounter (Signed)
Agree with referral to ENT/[facility].  Agree with follow-up visit tomorrow. No further action warranted.

## 2016-01-08 NOTE — Telephone Encounter (Signed)
Dr. Tamala Julian,  Patient called in today with c/o worsening dizziness all day States, she tred medication and exercises without relief Denies feeling faint or associated n/v States she was denies MRI from [facility] due to symptoms not appropriate for image  ENT to Dr. Redmond Baseman placed and return visit scheduled for tomorrow.  Please read previous messages

## 2016-01-08 NOTE — Telephone Encounter (Signed)
Pt said Dr. Tamala Julian told her after her visit that if she wasn't any better within two weeks to call her and she would make her a referral to ENT.  She also scheduled her an MRI, but the insurance denied it.  She continues to have dizziness and has called here a couple of times with no response.  She does want the referral, but wants to know if she needs to come back in.  I told her I didn't think that was necessary.  Please call asap [phone].

## 2016-01-09 ENCOUNTER — Ambulatory Visit (HOSPITAL_COMMUNITY): Payer: BC Managed Care – PPO

## 2016-01-09 ENCOUNTER — Encounter: Payer: Self-pay | Admitting: Family Medicine

## 2016-01-09 ENCOUNTER — Ambulatory Visit (INDEPENDENT_AMBULATORY_CARE_PROVIDER_SITE_OTHER): Payer: BC Managed Care – PPO | Admitting: Family Medicine

## 2016-01-09 VITALS — BP 110/78 | HR 73 | Temp 99.4°F | Resp 16 | Ht 63.0 in | Wt 162.4 lb

## 2016-01-09 DIAGNOSIS — H8111 Benign paroxysmal vertigo, right ear: Secondary | ICD-10-CM

## 2016-01-09 NOTE — Patient Instructions (Addendum)
IF you received an x-ray today, you will receive an invoice from [facility]. Please contact [facility] at [phone] with questions or concerns regarding your invoice.   IF you received labwork today, you will receive an invoice from [facility]. Please contact [facility] at [phone] with questions or concerns regarding your invoice.   Our billing staff will not be able to assist you with questions regarding bills from these companies.  You will be contacted with the lab results as soon as they are available. The fastest way to get your results is to activate your My Chart account. Instructions are located on the last page of this paperwork. If you have not heard from [place] regarding the results in 2 weeks, please contact this office.    Benign Positional Vertigo Introduction Vertigo is the feeling that you or your surroundings are moving when they are not. Benign positional vertigo is the most common form of vertigo. The cause of this condition is not serious (is benign). This condition is triggered by certain movements and positions (is positional). This condition can be dangerous if it occurs while you are doing something that could endanger you or others, such as driving. What are the causes? In many cases, the cause of this condition is not known. It may be caused by a disturbance in an area of the inner ear that helps your brain to sense movement and balance. This disturbance can be caused by a viral infection (labyrinthitis), head injury, or repetitive motion. What increases the risk? This condition is more likely to develop in:  Women.  People who are 66 years of age or older. What are the signs or symptoms? Symptoms of this condition usually happen when you move your head or your eyes in different directions. Symptoms may start suddenly, and they usually last for less than a minute. Symptoms may include:  Loss of balance and falling.  Feeling like you are  spinning or moving.  Feeling like your surroundings are spinning or moving.  Nausea and vomiting.  Blurred vision.  Dizziness.  Involuntary eye movement (nystagmus). Symptoms can be mild and cause only slight annoyance, or they can be severe and interfere with daily life. Episodes of benign positional vertigo may return (recur) over time, and they may be triggered by certain movements. Symptoms may improve over time. How is this diagnosed? This condition is usually diagnosed by medical history and a physical exam of the head, neck, and ears. You may be referred to a health care provider who specializes in ear, nose, and throat (ENT) problems (otolaryngologist) or a provider who specializes in disorders of the nervous system (neurologist). You may have additional testing, including:  MRI.  A CT scan.  Eye movement tests. Your health care provider may ask you to change positions quickly while he or she watches you for symptoms of benign positional vertigo, such as nystagmus. Eye movement may be tested with an electronystagmogram (ENG), caloric stimulation, the Dix-Hallpike test, or the roll test.  An electroencephalogram (EEG). This records electrical activity in your brain.  Hearing tests. How is this treated? Usually, your health care provider will treat this by moving your head in specific positions to adjust your inner ear back to normal. Surgery may be needed in severe cases, but this is rare. In some cases, benign positional vertigo may resolve on its own in 2-4 weeks. Follow these instructions at home: Safety  Move slowly.Avoid sudden body or head movements.  Avoid driving.  Avoid  operating heavy machinery.  Avoid doing any tasks that would be dangerous to you or others if a vertigo episode would occur.  If you have trouble walking or keeping your balance, try using a cane for stability. If you feel dizzy or unstable, sit down right away.  Return to your normal activities  as told by your health care provider. Ask your health care provider what activities are safe for you. General instructions  Take over-the-counter and prescription medicines only as told by your health care provider.  Avoid certain positions or movements as told by your health care provider.  Drink enough fluid to keep your urine clear or pale yellow.  Keep all follow-up visits as told by your health care provider. This is important. Contact a health care provider if:  You have a fever.  Your condition gets worse or you develop new symptoms.  Your family or friends notice any behavioral changes.  Your nausea or vomiting gets worse.  You have numbness or a "pins and needles" sensation. Get help right away if:  You have difficulty speaking or moving.  You are always dizzy.  You faint.  You develop severe headaches.  You have weakness in your legs or arms.  You have changes in your hearing or vision.  You develop a stiff neck.  You develop sensitivity to light. This information is not intended to replace advice given to you by your health care provider. Make sure you discuss any questions you have with your health care provider. Document Released: 10/15/2005 Document Revised: 06/15/2015 Document Reviewed: 05/02/2014  2017 Elsevier

## 2016-01-09 NOTE — Progress Notes (Signed)
Subjective:    Patient ID: Lori Wheeler, female    DOB: 1966/01/03, 50 y.o.   MRN: [id number]  01/09/2016  Follow-up (DIZZINESS)   HPI This 50 y.o. female presents for follow-up of BPV.  Insurance refused MRI brain.  Referred to ENT yesterday.  First episode on 12/01/2015.  Was with position changes; intermittent; worse in morning.  Now has progressed to constant throughout the day.  Doing the exercises; there is one video that is really great; it is an animation.  Shows crystals and why the maneuvers work.  If does not do perfectly, maneuver will fail.  Graphics are excellent.  Shows anatomy.  Either not doing correctly or not working.  Cannot narrow down; when put paper in purse, dizziness triggered; not sure if when moves eyes will trigger.  Dizziness is not as sever but more frequent.  No other associated symptoms.  No head congestion.  MRI denied by insurance. Scheduled initially for 01/10/16; then changed to 01/24/15.  With initial scheduling, everything is booked in December.    Going out of town next week; desires to defer physical therapy.  Not using Meclizine. Never taken.  Review of Systems  Constitutional: Negative for chills, diaphoresis, fatigue and fever.  Eyes: Negative for visual disturbance.  Respiratory: Negative for cough and shortness of breath.   Cardiovascular: Negative for chest pain, palpitations and leg swelling.  Gastrointestinal: Negative for abdominal pain, constipation, diarrhea, nausea and vomiting.  Endocrine: Negative for cold intolerance, heat intolerance, polydipsia, polyphagia and polyuria.  Neurological: Positive for dizziness. Negative for tremors, seizures, syncope, facial asymmetry, speech difficulty, weakness, light-headedness, numbness and headaches.    Past Medical History:  Diagnosis Date  . Allergy    Claritin daily  . Anxiety    Past Surgical History:  Procedure Laterality Date  . Admission     Behavioral Health admission; 2 weeks.    Marland Kitchen [place]   Not on File Current Outpatient Prescriptions  Medication Sig Dispense Refill  . b complex vitamins tablet Take 1 tablet by mouth daily.    . Calcium-Magnesium-Vitamin D (CALCIUM MAGNESIUM PO) Take 1,000 mg by mouth daily.    . Clindamycin Phosphate foam Apply 1 application topically 2 (two) times daily. 100 g 11  . fluticasone (FLONASE) 50 MCG/ACT nasal spray Place 2 sprays into both nostrils daily. 16 g 12  . Multiple Vitamin (MULTIVITAMIN) tablet Take 1 tablet by mouth daily.    . norethindrone-ethinyl estradiol (JUNEL FE,GILDESS FE,LOESTRIN FE) 1-20 MG-MCG tablet Take 1 tablet by mouth daily.    Marland Kitchen PARoxetine (PAXIL) 10 MG tablet Take 1 tablet (10 mg total) by mouth every morning. 30 tablet 11  . VITAMIN D, CHOLECALCIFEROL, PO Take 1,000 Units by mouth daily.    . meclizine (MEDI-MECLIZINE) 25 MG tablet Take 1 tablet (25 mg total) by mouth 3 (three) times daily as needed for dizziness. (Patient not taking: Reported on 01/09/2016) 30 tablet 0   No current facility-administered medications for this visit.    Social History   Social History  . Marital status: Married    Spouse name: N/A  . Number of children: N/A  . Years of education: N/A   Occupational History  . Dance movement psychotherapist    Social History Main Topics  . Smoking status: Former Smoker    Years: 1.00    Types: Cigarettes    Quit date: 01/22/1983  . Smokeless tobacco: Never Used  . Alcohol use Yes     Comment: beer -  6 pack/week  . Drug use: Unknown  . Sexual activity: Yes   Other Topics Concern  . Not on file   Social History Narrative   Marital status:  Married x 27 years      Children: none; one dog      Lives: with husband, dog.       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 Diabetes Brother   . Hypertension Brother   . Hyperlipidemia Brother    . Cancer Brother     kidney cancer  . Hyperlipidemia Brother   . Hypertension Brother   . Hyperlipidemia Brother   . Hypertension Brother   . Cancer Father        Objective:    BP 110/78 (BP Location: Left Arm, Patient Position: Sitting, Cuff Size: Normal)   Pulse 73   Temp 99.4 F (37.4 C) (Oral)   Resp 16   Ht '5\' 3"'  (1.6 m)   Wt 162 lb 6.4 oz (73.7 kg)   LMP 12/17/2015   SpO2 98%   BMI 28.77 kg/m  Physical Exam  Constitutional: She is oriented to person, place, and time. She appears well-developed and well-nourished. No distress.  HENT:  Head: Normocephalic and atraumatic.  Right Ear: External ear normal.  Left Ear: External ear normal.  Nose: Nose normal.  Mouth/Throat: Oropharynx is clear and moist.  Eyes: Conjunctivae and EOM are normal. Pupils are equal, round, and reactive to light.  Neck: Normal range of motion. Neck supple. Carotid bruit is not present. No thyromegaly present.  Cardiovascular: Normal rate, regular rhythm, normal heart sounds and intact distal pulses.  Exam reveals no gallop and no friction rub.   No murmur heard. Pulmonary/Chest: Effort normal and breath sounds normal. She has no wheezes. She has no rales.  Abdominal: Soft. Bowel sounds are normal. She exhibits no distension and no mass. There is no tenderness. There is no rebound and no guarding.  Lymphadenopathy:    She has no cervical adenopathy.  Neurological: She is alert and oriented to person, place, and time. No cranial nerve deficit. She exhibits normal muscle tone. Coordination normal.  Marye Round performed; minimal dizziness elicited; no nystagmus.  Skin: Skin is warm and dry. No rash noted. She is not diaphoretic. No erythema. No pallor.  Psychiatric: She has a normal mood and affect. Her behavior is normal.   Results for orders placed or performed in visit on 12/19/15  CBC with Differential/Platelet  Result Value Ref Range   WBC 8.0 3.8 - 10.8 K/uL   RBC 4.23 3.80 - 5.10  MIL/uL   Hemoglobin 12.9 11.7 - 15.5 g/dL   HCT 38.9 35.0 - 45.0 %   MCV 92.0 80.0 - 100.0 fL   MCH 30.5 27.0 - 33.0 pg   MCHC 33.2 32.0 - 36.0 g/dL   RDW 12.9 11.0 - 15.0 %   Platelets 324 140 - 400 K/uL   MPV 10.4 7.5 - 12.5 fL   Neutro Abs 4,240 1,500 - 7,800 cells/uL   Lymphs Abs 2,960 850 - 3,900 cells/uL   Monocytes Absolute 640 200 - 950 cells/uL   Eosinophils Absolute 160 15 - 500 cells/uL   Basophils Absolute 0 0 - 200 cells/uL   Neutrophils Relative % 53 %  Lymphocytes Relative 37 %   Monocytes Relative 8 %   Eosinophils Relative 2 %   Basophils Relative 0 %   Smear Review Criteria for review not met   Comprehensive metabolic panel  Result Value Ref Range   Sodium 137 135 - 146 mmol/L   Potassium 3.9 3.5 - 5.3 mmol/L   Chloride 103 98 - 110 mmol/L   CO2 24 20 - 31 mmol/L   Glucose, Bld 80 65 - 99 mg/dL   BUN 9 7 - 25 mg/dL   Creat 0.77 0.50 - 1.10 mg/dL   Total Bilirubin 0.3 0.2 - 1.2 mg/dL   Alkaline Phosphatase 49 33 - 115 U/L   AST 24 10 - 35 U/L   ALT 20 6 - 29 U/L   Total Protein 7.2 6.1 - 8.1 g/dL   Albumin 4.4 3.6 - 5.1 g/dL   Calcium 9.3 8.6 - 10.2 mg/dL       Assessment & Plan:   1. Benign paroxysmal positional vertigo of right ear     Orders Placed This Encounter  Procedures  . Ambulatory referral to Physical Therapy    Referral Priority:   Routine    Referral Type:   Physical Medicine    Referral Reason:   Specialty Services Required    Requested Specialty:   Physical Therapy    Number of Visits Requested:   1   No orders of the defined types were placed in this encounter.   No Follow-up on file.  Kasee Hantz Elayne Guerin, M.D. [facility] [address] [place], [place]  [phone] phone [phone] fax

## 2016-01-24 ENCOUNTER — Telehealth: Payer: Self-pay | Admitting: Emergency Medicine

## 2016-01-24 ENCOUNTER — Ambulatory Visit (HOSPITAL_COMMUNITY)
Admission: RE | Admit: 2016-01-24 | Discharge: 2016-01-24 | Disposition: A | Discharge status: Home / Self Care | Payer: BC Managed Care – PPO | Source: Ambulatory Visit | Attending: Family Medicine | Admitting: Family Medicine

## 2016-01-24 DIAGNOSIS — R42 Dizziness and giddiness: Secondary | ICD-10-CM | POA: Diagnosis not present

## 2016-01-24 DIAGNOSIS — Z808 Family history of malignant neoplasm of other organs or systems: Secondary | ICD-10-CM | POA: Diagnosis present

## 2016-01-24 NOTE — Telephone Encounter (Signed)
-----   Message from Wardell Honour, MD sent at 01/24/2016 10:02 AM EST ----- Call --- MRI brain is normal.  There is minimal thickening of maxillary sinuses due to nasal congestion; I would recommend Flonase nasal spray daily.

## 2016-04-12 ENCOUNTER — Encounter: Payer: Self-pay | Admitting: Family Medicine

## 2016-05-13 ENCOUNTER — Telehealth: Payer: Self-pay | Admitting: Family Medicine

## 2016-05-13 NOTE — Telephone Encounter (Signed)
Pt is needing a call back from dr Tamala Julian [phone]

## 2016-05-16 NOTE — Telephone Encounter (Signed)
Painful hemorrhoids would like to use a cream rx to try help with dicomfort. No bleeding. If no relief would come in for appt.

## 2016-05-16 NOTE — Telephone Encounter (Signed)
LM for patient requesting more information for Dr. Tamala Julian.

## 2016-05-20 NOTE — Telephone Encounter (Signed)
I don't see any documentation of previous diagnoses or discussion of hemorrhoids.  Agree will need office visit if OTC medications (tucks pads, preparation H, and soaks are not effective).  Please offer patient an appointment with me this week.

## 2016-05-21 ENCOUNTER — Ambulatory Visit (INDEPENDENT_AMBULATORY_CARE_PROVIDER_SITE_OTHER): Payer: BC Managed Care – PPO | Admitting: Family Medicine

## 2016-05-21 VITALS — BP 124/83 | HR 76 | Temp 99.0°F | Resp 16 | Ht 63.0 in | Wt 155.0 lb

## 2016-05-21 DIAGNOSIS — K644 Residual hemorrhoidal skin tags: Secondary | ICD-10-CM

## 2016-05-21 MED ORDER — HYDROCORTISONE ACETATE 25 MG RE SUPP: 25.0000 mg | Freq: Two times a day (BID) | RECTAL | 3 refills | Status: DC

## 2016-05-21 NOTE — Progress Notes (Signed)
Subjective:    Patient ID: Lori Wheeler, female    DOB: [birth year], 51 y.o.   MRN: [id number]  05/21/2016  Hemorrhoids (x 3 wks)   HPI This 51 y.o. female presents for evaluation of likely hemorrhoidal pain; having horrible anal pain.   Ten years ago, sat on a boat and developed hemorrhoidal pain.  Sitze baths and resolved.    [place], ran a half marathon for ALS; friend diagnosed with ALS.  Friend so appreciative.  Night before worried about needing to have a bm on the course.  Then baseball season started and then sitted 3 days on the row.  Morning after race.  Sitz baths and red whole book.   Brother has had hemorrhoids in the past and had them tied; rx cream. Denies blood; +pain; +itching. No bloody stools; no melena; no change in bowel habits. No family hx of colon cancer.    Review of Systems  Constitutional: Negative for chills, diaphoresis, fatigue and fever.  HENT: Negative for ear pain, postnasal drip, rhinorrhea, sinus pressure, sore throat and trouble swallowing.   Respiratory: Negative for cough and shortness of breath.   Cardiovascular: Negative for chest pain, palpitations and leg swelling.  Gastrointestinal: Positive for rectal pain. Negative for abdominal pain, constipation, diarrhea, nausea and vomiting.    Past Medical History:  Diagnosis Date  . Allergy    Claritin daily  . Anxiety    Past Surgical History:  Procedure Laterality Date  . Admission     Behavioral Health admission; 2 weeks.  [facility] [place]   Not on File  Social History   Social History  . Marital status: Married    Spouse name: N/A  . Number of children: N/A  . Years of education: N/A   Occupational History  . Dance movement psychotherapist    Social History Main Topics  . Smoking status: Former Smoker    Years: 1.00    Types: Cigarettes    Quit date: 01/22/1983  . Smokeless tobacco: Never Used  . Alcohol use Yes     Comment: beer - 6 pack/week  . Drug use: Unknown  .  Sexual activity: Yes   Other Topics Concern  . Not on file   Social History Narrative   Marital status:  Married x 27 years      Children: none; one dog      Lives: with husband, dog.       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 Diabetes Brother   . Hypertension Brother   . Hyperlipidemia Brother   . Cancer Brother     kidney cancer  . Hyperlipidemia Brother   . Hypertension Brother   . Hyperlipidemia Brother   . Hypertension Brother   . Cancer Father        Objective:    BP 124/83   Pulse 76   Temp 99 F (37.2 C) (Oral)   Resp 16   Ht 5\' 3"  (1.6 m)   Wt 155 lb (70.3 kg)   LMP 05/07/2016   SpO2 98%   BMI 27.46 kg/m  Physical Exam  Constitutional: She is oriented to person, place, and time. She appears well-developed and well-nourished. No distress.  HENT:  Head: Normocephalic and  atraumatic.  Eyes: Conjunctivae are normal. Pupils are equal, round, and reactive to light.  Neck: Normal range of motion. Neck supple.  Cardiovascular: Normal rate, regular rhythm and normal heart sounds.  Exam reveals no gallop and no friction rub.   No murmur heard. Pulmonary/Chest: Effort normal and breath sounds normal. She has no wheezes. She has no rales.  Abdominal: Soft. Bowel sounds are normal. She exhibits no distension and no mass. There is no tenderness. There is no rebound and no guarding.  Genitourinary: Rectal exam shows external hemorrhoid and tenderness. Rectal exam shows no fissure, no mass and anal tone normal.  Genitourinary Comments: +enlarged/inflamed mildly thrombosed hemorrhoid.  Neurological: She is alert and oriented to person, place, and time.  Skin: She is not diaphoretic.  Psychiatric: She has a normal mood and affect. Lori Wheeler behavior is normal.  Nursing note and vitals reviewed.       Assessment & Plan:   1. Inflamed  external hemorrhoid    -new. -continue Sitz baths twice daily. -rx for Anusol HC suppositories to use bid. -if worsens, RTC for I&D. -if no improvement in 2-3 weeks, RTC. -recommend stool softener daily yet avoid loose stools or diarrhea.   No orders of the defined types were placed in this encounter.  Meds ordered this encounter  Medications  . hydrocortisone (ANUSOL-HC) 25 MG suppository    Sig: Place 1 suppository (25 mg total) rectally 2 (two) times daily.    Dispense:  24 suppository    Refill:  3    No Follow-up on file.   Christropher Gintz Elayne Guerin, M.D. [facility] at [facility] [address] [place], [place]  [address] [phone] phone [phone] fax

## 2016-05-21 NOTE — Patient Instructions (Addendum)
   IF you received an x-ray today, you will receive an invoice from [facility]. Please contact [facility] at [phone] with questions or concerns regarding your invoice.   IF you received labwork today, you will receive an invoice from [facility]. Please contact [facility] at [phone] with questions or concerns regarding your invoice.   Our billing staff will not be able to assist you with questions regarding bills from these companies.  You will be contacted with the lab results as soon as they are available. The fastest way to get your results is to activate your My Chart account. Instructions are located on the last page of this paperwork. If you have not heard from us regarding the results in 2 weeks, please contact this office.     Hemorrhoids Hemorrhoids are swollen veins in and around the rectum or anus. There are two types of hemorrhoids:  Internal hemorrhoids. These occur in the veins that are just inside the rectum. They may poke through to the outside and become irritated and painful.  External hemorrhoids. These occur in the veins that are outside of the anus and can be felt as a painful swelling or hard lump near the anus.  Most hemorrhoids do not cause serious problems, and they can be managed with home treatments such as diet and lifestyle changes. If home treatments do not help your symptoms, procedures can be done to shrink or remove the hemorrhoids. What are the causes? This condition is caused by increased pressure in the anal area. This pressure may result from various things, including:  Constipation.  Straining to have a bowel movement.  Diarrhea.  Pregnancy.  Obesity.  Sitting for long periods of time.  Heavy lifting or other activity that causes you to strain.  Anal sex.  What are the signs or symptoms? Symptoms of this condition include:  Pain.  Anal itching or irritation.  Rectal bleeding.  Leakage of stool  (feces).  Anal swelling.  One or more lumps around the anus.  How is this diagnosed? This condition can often be diagnosed through a visual exam. Other exams or tests may also be done, such as:  Examination of the rectal area with a gloved hand (digital rectal exam).  Examination of the anal canal using a small tube (anoscope).  A blood test, if you have lost a significant amount of blood.  A test to look inside the colon (sigmoidoscopy or colonoscopy).  How is this treated? This condition can usually be treated at home. However, various procedures may be done if dietary changes, lifestyle changes, and other home treatments do not help your symptoms. These procedures can help make the hemorrhoids smaller or remove them completely. Some of these procedures involve surgery, and others do not. Common procedures include:  Rubber band ligation. Rubber bands are placed at the base of the hemorrhoids to cut off the blood supply to them.  Sclerotherapy. Medicine is injected into the hemorrhoids to shrink them.  Infrared coagulation. A type of light energy is used to get rid of the hemorrhoids.  Hemorrhoidectomy surgery. The hemorrhoids are surgically removed, and the veins that supply them are tied off.  Stapled hemorrhoidopexy surgery. A circular stapling device is used to remove the hemorrhoids and use staples to cut off the blood supply to them.  Follow these instructions at home: Eating and drinking  Eat foods that have a lot of fiber in them, such as whole grains, beans, nuts, fruits, and vegetables. Ask your health care   provider about taking products that have added fiber (fiber supplements).  Drink enough fluid to keep your urine clear or pale yellow. Managing pain and swelling  Take warm sitz baths for 20 minutes, 3-4 times a day to ease pain and discomfort.  If directed, apply ice to the affected area. Using ice packs between sitz baths may be helpful. ? Put ice in a plastic  bag. ? Place a towel between your skin and the bag. ? Leave the ice on for 20 minutes, 2-3 times a day. General instructions  Take over-the-counter and prescription medicines only as told by your health care provider.  Use medicated creams or suppositories as told.  Exercise regularly.  Go to the bathroom when you have the urge to have a bowel movement. Do not wait.  Avoid straining to have bowel movements.  Keep the anal area dry and clean. Use wet toilet paper or moist towelettes after a bowel movement.  Do not sit on the toilet for long periods of time. This increases blood pooling and pain. Contact a health care provider if:  You have increasing pain and swelling that are not controlled by treatment or medicine.  You have uncontrolled bleeding.  You have difficulty having a bowel movement, or you are unable to have a bowel movement.  You have pain or inflammation outside the area of the hemorrhoids. This information is not intended to replace advice given to you by your health care provider. Make sure you discuss any questions you have with your health care provider. Document Released: 01/05/2000 Document Revised: 06/07/2015 Document Reviewed: 09/21/2014 Elsevier Interactive Patient Education  2017 Elsevier Inc.  

## 2016-07-26 ENCOUNTER — Other Ambulatory Visit: Payer: Self-pay | Admitting: Family Medicine

## 2016-09-06 ENCOUNTER — Ambulatory Visit (INDEPENDENT_AMBULATORY_CARE_PROVIDER_SITE_OTHER): Payer: BC Managed Care – PPO | Admitting: Family Medicine

## 2016-09-06 ENCOUNTER — Encounter: Payer: Self-pay | Admitting: Family Medicine

## 2016-09-06 VITALS — BP 113/75 | HR 69 | Temp 98.4°F | Resp 18 | Ht 63.0 in | Wt 152.0 lb

## 2016-09-06 DIAGNOSIS — Z131 Encounter for screening for diabetes mellitus: Secondary | ICD-10-CM

## 2016-09-06 DIAGNOSIS — Z136 Encounter for screening for cardiovascular disorders: Secondary | ICD-10-CM | POA: Diagnosis not present

## 2016-09-06 DIAGNOSIS — Z0000 Encounter for general adult medical examination without abnormal findings: Secondary | ICD-10-CM

## 2016-09-06 DIAGNOSIS — F411 Generalized anxiety disorder: Secondary | ICD-10-CM

## 2016-09-06 DIAGNOSIS — E559 Vitamin D deficiency, unspecified: Secondary | ICD-10-CM

## 2016-09-06 DIAGNOSIS — Z13220 Encounter for screening for lipoid disorders: Secondary | ICD-10-CM | POA: Diagnosis not present

## 2016-09-06 LAB — POCT URINALYSIS DIP (MANUAL ENTRY)
BILIRUBIN UA: NEGATIVE mg/dL
Bilirubin, UA: NEGATIVE
GLUCOSE UA: NEGATIVE mg/dL
LEUKOCYTES UA: NEGATIVE
Nitrite, UA: NEGATIVE
PROTEIN UA: NEGATIVE mg/dL
SPEC GRAV UA: 1.02 (ref 1.010–1.025)
Urobilinogen, UA: 0.2 E.U./dL
pH, UA: 7 (ref 5.0–8.0)

## 2016-09-06 MED ORDER — PAROXETINE HCL 10 MG PO TABS: 10.0000 mg | ORAL_TABLET | ORAL | 3 refills | Status: DC

## 2016-09-06 NOTE — Patient Instructions (Signed)
   IF you received an x-ray today, you will receive an invoice from [facility]. Please contact [facility] at [phone] with questions or concerns regarding your invoice.   IF you received labwork today, you will receive an invoice from [facility]. Please contact [facility] at [phone] with questions or concerns regarding your invoice.   Our billing staff will not be able to assist you with questions regarding bills from these companies.  You will be contacted with the lab results as soon as they are available. The fastest way to get your results is to activate your My Chart account. Instructions are located on the last page of this paperwork. If you have not heard from us regarding the results in 2 weeks, please contact this office.      Preventive Care 40-64 Years, Female Preventive care refers to lifestyle choices and visits with your health care provider that can promote health and wellness. What does preventive care include?  A yearly physical exam. This is also called an annual well check.  Dental exams once or twice a year.  Routine eye exams. Ask your health care provider how often you should have your eyes checked.  Personal lifestyle choices, including: ? Daily care of your teeth and gums. ? Regular physical activity. ? Eating a healthy diet. ? Avoiding tobacco and drug use. ? Limiting alcohol use. ? Practicing safe sex. ? Taking low-dose aspirin daily starting at age 50. ? Taking vitamin and mineral supplements as recommended by your health care provider. What happens during an annual well check? The services and screenings done by your health care provider during your annual well check will depend on your age, overall health, lifestyle risk factors, and family history of disease. Counseling Your health care provider may ask you questions about your:  Alcohol use.  Tobacco use.  Drug use.  Emotional well-being.  Home and relationship  well-being.  Sexual activity.  Eating habits.  Work and work environment.  Method of birth control.  Menstrual cycle.  Pregnancy history.  Screening You may have the following tests or measurements:  Height, weight, and BMI.  Blood pressure.  Lipid and cholesterol levels. These may be checked every 5 years, or more frequently if you are over 50 years old.  Skin check.  Lung cancer screening. You may have this screening every year starting at age 55 if you have a 30-pack-year history of smoking and currently smoke or have quit within the past 15 years.  Fecal occult blood test (FOBT) of the stool. You may have this test every year starting at age 50.  Flexible sigmoidoscopy or colonoscopy. You may have a sigmoidoscopy every 5 years or a colonoscopy every 10 years starting at age 50.  Hepatitis C blood test.  Hepatitis B blood test.  Sexually transmitted disease (STD) testing.  Diabetes screening. This is done by checking your blood sugar (glucose) after you have not eaten for a while (fasting). You may have this done every 1-3 years.  Mammogram. This may be done every 1-2 years. Talk to your health care provider about when you should start having regular mammograms. This may depend on whether you have a family history of breast cancer.  BRCA-related cancer screening. This may be done if you have a family history of breast, ovarian, tubal, or peritoneal cancers.  Pelvic exam and Pap test. This may be done every 3 years starting at age 21. Starting at age 30, this may be done every 5 years if   you have a Pap test in combination with an HPV test.  Bone density scan. This is done to screen for osteoporosis. You may have this scan if you are at high risk for osteoporosis.  Discuss your test results, treatment options, and if necessary, the need for more tests with your health care provider. Vaccines Your health care provider may recommend certain vaccines, such  as:  Influenza vaccine. This is recommended every year.  Tetanus, diphtheria, and acellular pertussis (Tdap, Td) vaccine. You may need a Td booster every 10 years.  Varicella vaccine. You may need this if you have not been vaccinated.  Zoster vaccine. You may need this after age 60.  Measles, mumps, and rubella (MMR) vaccine. You may need at least one dose of MMR if you were born in 1957 or later. You may also need a second dose.  Pneumococcal 13-valent conjugate (PCV13) vaccine. You may need this if you have certain conditions and were not previously vaccinated.  Pneumococcal polysaccharide (PPSV23) vaccine. You may need one or two doses if you smoke cigarettes or if you have certain conditions.  Meningococcal vaccine. You may need this if you have certain conditions.  Hepatitis A vaccine. You may need this if you have certain conditions or if you travel or work in places where you may be exposed to hepatitis A.  Hepatitis B vaccine. You may need this if you have certain conditions or if you travel or work in places where you may be exposed to hepatitis B.  Haemophilus influenzae type b (Hib) vaccine. You may need this if you have certain conditions.  Talk to your health care provider about which screenings and vaccines you need and how often you need them. This information is not intended to replace advice given to you by your health care provider. Make sure you discuss any questions you have with your health care provider. Document Released: 02/03/2015 Document Revised: 09/27/2015 Document Reviewed: 11/08/2014 Elsevier Interactive Patient Education  2017 [employer]vier Inc.  

## 2016-09-06 NOTE — Progress Notes (Signed)
Subjective:    Patient ID: Lori Wheeler, female    DOB: August 22, 1965, 51 y.o.   MRN: [id number]  09/06/2016  Annual Exam (No PAP needed) and Medication Refill (Paxil 10 MG)   HPI This 51 y.o. female presents for Complete Physical Examination.  Last physical:  02-24-2014 Pap smear:  [place] 09/2015; OCP Mammogram:  09/2015 Eye exam:  Every year; contacts Dental exam:  Every six months.   Visual Acuity Screening   Right eye Left eye Both eyes  Without correction:     With correction: 20/20 20/20 20/20      BP Readings from Last 3 Encounters:  09/06/16 113/75  05/21/16 124/83  01/09/16 110/78   Wt Readings from Last 3 Encounters:  09/06/16 152 lb (68.9 kg)  05/21/16 155 lb (70.3 kg)  01/09/16 162 lb 6.4 oz (73.7 kg)   Immunization History  Administered Date(s) Administered  . Td 01/21/2002  . Tdap 08/18/2012    Review of Systems  Constitutional: Negative for activity change, appetite change, chills, diaphoresis, fatigue, fever and unexpected weight change.  HENT: Negative for congestion, dental problem, drooling, ear discharge, ear pain, facial swelling, hearing loss, mouth sores, nosebleeds, postnasal drip, rhinorrhea, sinus pressure, sneezing, sore throat, tinnitus, trouble swallowing and voice change.   Eyes: Negative for photophobia, pain, discharge, redness, itching and visual disturbance.  Respiratory: Negative for apnea, cough, choking, chest tightness, shortness of breath, wheezing and stridor.   Cardiovascular: Negative for chest pain, palpitations and leg swelling.  Gastrointestinal: Negative for abdominal distention, abdominal pain, anal bleeding, blood in stool, constipation, diarrhea, nausea, rectal pain and vomiting.  Endocrine: Negative for cold intolerance, heat intolerance, polydipsia, polyphagia and polyuria.  Genitourinary: Negative for decreased urine volume, difficulty urinating, dyspareunia, dysuria, enuresis, flank pain,  frequency, genital sores, hematuria, menstrual problem, pelvic pain, urgency, vaginal bleeding, vaginal discharge and vaginal pain.  Musculoskeletal: Negative for arthralgias, back pain, gait problem, joint swelling, myalgias, neck pain and neck stiffness.  Skin: Negative for color change, pallor, rash and wound.  Allergic/Immunologic: Positive for environmental allergies. Negative for food allergies and immunocompromised state.  Neurological: Negative for dizziness, tremors, seizures, syncope, facial asymmetry, speech difficulty, weakness, light-headedness, numbness and headaches.  Hematological: Negative for adenopathy. Does not bruise/bleed easily.  Psychiatric/Behavioral: Negative for agitation, behavioral problems, confusion, decreased concentration, dysphoric mood, hallucinations, self-injury, sleep disturbance and suicidal ideas. The patient is not nervous/anxious and is not hyperactive.     Past Medical History:  Diagnosis Date  . Allergy    Claritin daily  . Anxiety    Past Surgical History:  Procedure Laterality Date  . Admission     [facility] admission; 2 weeks.  Marland Kitchen MANDIBLE SURGERY  1991   No Known Allergies Current Outpatient Prescriptions  Medication Sig Dispense Refill  . b complex vitamins tablet Take 1 tablet by mouth daily.    . Calcium-Magnesium-Vitamin D (CALCIUM MAGNESIUM PO) Take 1,000 mg by mouth daily.    . Clindamycin Phosphate foam Apply 1 application topically 2 (two) times daily. 100 g 11  . Multiple Vitamin (MULTIVITAMIN) tablet Take 1 tablet by mouth daily.    . norethindrone-ethinyl estradiol (JUNEL FE,GILDESS FE,LOESTRIN FE) 1-20 MG-MCG tablet Take 1 tablet by mouth daily.    Marland Kitchen PARoxetine (PAXIL) 10 MG tablet Take 1 tablet (10 mg total) by mouth every morning. 90 tablet 3  . VITAMIN D, CHOLECALCIFEROL, PO Take 1,000 Units by mouth daily.    . fluticasone (FLONASE) 50 MCG/ACT nasal spray Place 2  sprays into both nostrils daily. (Patient not taking:  Reported on 09/06/2016) 16 g 12  . hydrocortisone (ANUSOL-HC) 25 MG suppository Place 1 suppository (25 mg total) rectally 2 (two) times daily. (Patient not taking: Reported on 09/06/2016) 24 suppository 3   No current facility-administered medications for this visit.    Social History   Social History  . Marital status: Married    Spouse name: N/A  . Number of children: N/A  . Years of education: N/A   Occupational History  . Dance movement psychotherapist    Social History Main Topics  . Smoking status: Former Smoker    Years: 1.00    Types: Cigarettes    Quit date: 01/22/1983  . Smokeless tobacco: Never Used  . Alcohol use Yes     Comment: beer - 6 pack/week  . Drug use: Unknown  . Sexual activity: Yes    Birth control/ protection: Pill   Other Topics Concern  . Not on file   Social History Narrative   Marital status:  Married x 28 years      Children: none; one dog      Lives: with husband, dog.       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      Diet:  VEGETARIAN; Tofu and beans; smoothies with protein powder; B complex   Family History  Problem Relation Age of Onset  . Diabetes Brother   . Hypertension Brother   . Hyperlipidemia Brother   . Cancer Brother        kidney cancer  . Hyperlipidemia Brother   . Hypertension Brother   . Hyperlipidemia Brother   . Hypertension Brother   . Cancer Father        brain tumor       Objective:    BP 113/75 (BP Location: Right Arm, Patient Position: Sitting, Cuff Size: Normal)   Pulse 69   Temp 98.4 F (36.9 C) (Oral)   Resp 18   Ht 5\' 3"  (1.6 m)   Wt 152 lb (68.9 kg)   LMP 09/01/2016   SpO2 98%   BMI 26.93 kg/m  Physical Exam  Constitutional: She is oriented to person, place, and time. She appears well-developed and well-nourished. No distress.  HENT:  Head: Normocephalic and  atraumatic.  Right Ear: External ear normal.  Left Ear: External ear normal.  Nose: Nose normal.  Mouth/Throat: Oropharynx is clear and moist.  Eyes: Pupils are equal, round, and reactive to light. Conjunctivae and EOM are normal.  Neck: Normal range of motion and full passive range of motion without pain. Neck supple. No JVD present. Carotid bruit is not present. No thyromegaly present.  Cardiovascular: Normal rate, regular rhythm and normal heart sounds.  Exam reveals no gallop and no friction rub.   No murmur heard. Pulmonary/Chest: Effort normal and breath sounds normal. She has no wheezes. She has no rales.  Abdominal: Soft. Bowel sounds are normal. She exhibits no distension and no mass. There is no tenderness. There is no rebound and no guarding.  Musculoskeletal:       Right shoulder: Normal.       Left shoulder: Normal.       Cervical back: Normal.  Lymphadenopathy:    She has  no cervical adenopathy.  Neurological: She is alert and oriented to person, place, and time. She has normal reflexes. No cranial nerve deficit. She exhibits normal muscle tone. Coordination normal.  Skin: Skin is warm and dry. No rash noted. She is not diaphoretic. No erythema. No pallor.  Psychiatric: She has a normal mood and affect. Her behavior is normal. Judgment and thought content normal.  Nursing note and vitals reviewed.   No results found. Depression screen [facility] 2/9 09/06/2016 05/21/2016 01/09/2016 12/19/2015 09/19/2015  Decreased Interest 0 0 0 0 0  Down, Depressed, Hopeless 0 0 0 0 0  PHQ - 2 Score 0 0 0 0 0   Fall Risk  09/06/2016 05/21/2016 01/09/2016 12/19/2015 09/19/2015  Falls in the past year? No No No No No        Assessment & Plan:   1. Routine physical examination   2. Generalized anxiety disorder   3. Screening cholesterol level   4. Screening for diabetes mellitus   5. Vitamin D deficiency   6. Encounter for screening for cardiovascular disorders    -anticipatory guidance  provided --- exercise, weight loss, safe driving practices, calcium 600mg  bid.  -obtain age appropriate screening labs and labs for chronic disease management. -anxiety controlled with Paxil 10mg  daily; refill provided; RTC 1 year. -recommend weight loss, exercise for 30-60 minutes five days per week; recommend 1200 kcal restriction per day with a minimum of 60 grams of protein per day.  Congrats on weight loss; goal weight for patient personally is 150.    Orders Placed This Encounter  Procedures  . CBC with Differential/Platelet  . Comprehensive metabolic panel    Order Specific Question:   Has the patient fasted?    Answer:   Yes  . Hemoglobin A1c  . Lipid panel    Order Specific Question:   Has the patient fasted?    Answer:   Yes  . TSH  . VITAMIN D 25 Hydroxy (Vit-D Deficiency, Fractures)  . POCT urinalysis dipstick  . EKG 12-Lead   Meds ordered this encounter  Medications  . PARoxetine (PAXIL) 10 MG tablet    Sig: Take 1 tablet (10 mg total) by mouth every morning.    Dispense:  90 tablet    Refill:  3    Return in about 1 year (around 09/06/2017) for complete physical examiniation.   Aamani Moose Elayne Guerin, M.D. [facility] [address] [place], [place]  [address] [phone] phone [phone] fax

## 2016-09-07 LAB — HEMOGLOBIN A1C
ESTIMATED AVERAGE GLUCOSE: 108 mg/dL
Hgb A1c MFr Bld: 5.4 % (ref 4.8–5.6)

## 2016-09-07 LAB — CBC WITH DIFFERENTIAL/PLATELET
BASOS ABS: 0.1 10*3/uL (ref 0.0–0.2)
BASOS: 1 %
EOS (ABSOLUTE): 0.2 10*3/uL (ref 0.0–0.4)
EOS: 2 %
HEMATOCRIT: 39.2 % (ref 34.0–46.6)
HEMOGLOBIN: 12.8 g/dL (ref 11.1–15.9)
Immature Grans (Abs): 0 10*3/uL (ref 0.0–0.1)
Immature Granulocytes: 0 %
LYMPHS ABS: 1.9 10*3/uL (ref 0.7–3.1)
Lymphs: 28 %
MCH: 30.3 pg (ref 26.6–33.0)
MCHC: 32.7 g/dL (ref 31.5–35.7)
MCV: 93 fL (ref 79–97)
MONOCYTES: 5 %
Monocytes Absolute: 0.4 10*3/uL (ref 0.1–0.9)
NEUTROS ABS: 4.1 10*3/uL (ref 1.4–7.0)
Neutrophils: 64 %
Platelets: 293 10*3/uL (ref 150–379)
RBC: 4.22 x10E6/uL (ref 3.77–5.28)
RDW: 12.9 % (ref 12.3–15.4)
WBC: 6.6 10*3/uL (ref 3.4–10.8)

## 2016-09-07 LAB — TSH: TSH: 2.52 u[IU]/mL (ref 0.450–4.500)

## 2016-09-07 LAB — COMPREHENSIVE METABOLIC PANEL
ALBUMIN: 4.4 g/dL (ref 3.5–5.5)
ALT: 24 IU/L (ref 0–32)
AST: 24 IU/L (ref 0–40)
Albumin/Globulin Ratio: 1.7 (ref 1.2–2.2)
Alkaline Phosphatase: 46 IU/L (ref 39–117)
BUN/Creatinine Ratio: 9 (ref 9–23)
BUN: 8 mg/dL (ref 6–24)
Bilirubin Total: 0.4 mg/dL (ref 0.0–1.2)
CALCIUM: 9.7 mg/dL (ref 8.7–10.2)
CO2: 20 mmol/L (ref 20–29)
Chloride: 103 mmol/L (ref 96–106)
Creatinine, Ser: 0.88 mg/dL (ref 0.57–1.00)
GFR, EST AFRICAN AMERICAN: 89 mL/min/{1.73_m2} (ref >59)
GFR, EST NON AFRICAN AMERICAN: 77 mL/min/{1.73_m2} (ref >59)
GLOBULIN, TOTAL: 2.6 g/dL (ref 1.5–4.5)
Glucose: 87 mg/dL (ref 65–99)
POTASSIUM: 4.4 mmol/L (ref 3.5–5.2)
Sodium: 139 mmol/L (ref 134–144)
TOTAL PROTEIN: 7 g/dL (ref 6.0–8.5)

## 2016-09-07 LAB — VITAMIN D 25 HYDROXY (VIT D DEFICIENCY, FRACTURES): Vit D, 25-Hydroxy: 42 ng/mL (ref 30.0–100.0)

## 2016-09-07 LAB — LIPID PANEL
CHOLESTEROL TOTAL: 229 mg/dL — AB (ref 100–199)
Chol/HDL Ratio: 3.3 ratio (ref 0.0–4.4)
HDL: 70 mg/dL (ref >39)
LDL CALC: 131 mg/dL — AB (ref 0–99)
TRIGLYCERIDES: 140 mg/dL (ref 0–149)
VLDL CHOLESTEROL CAL: 28 mg/dL (ref 5–40)

## 2017-06-12 ENCOUNTER — Encounter: Payer: Self-pay | Admitting: Family Medicine

## 2017-08-16 ENCOUNTER — Other Ambulatory Visit: Payer: Self-pay | Admitting: Family Medicine

## 2017-08-18 NOTE — Telephone Encounter (Signed)
Aunsol HC 25 mg suppository  LOV 09/06/16 with Dr. Tamala Julian    Has appt coming up on 09/04/17 with Windell Hummingbird.  LR:  05/21/16  #[id number]  Refills:  3  [facility] [place], [place]

## 2017-09-04 ENCOUNTER — Encounter: Payer: BC Managed Care – PPO | Admitting: Physician Assistant

## 2017-09-18 ENCOUNTER — Ambulatory Visit (INDEPENDENT_AMBULATORY_CARE_PROVIDER_SITE_OTHER): Payer: BC Managed Care – PPO | Admitting: Family Medicine

## 2017-09-18 ENCOUNTER — Encounter: Payer: Self-pay | Admitting: Family Medicine

## 2017-09-18 ENCOUNTER — Other Ambulatory Visit: Payer: Self-pay

## 2017-09-18 VITALS — BP 108/72 | HR 79 | Temp 98.7°F | Resp 16 | Ht 63.25 in | Wt 152.2 lb

## 2017-09-18 DIAGNOSIS — M25561 Pain in right knee: Secondary | ICD-10-CM

## 2017-09-18 DIAGNOSIS — E785 Hyperlipidemia, unspecified: Secondary | ICD-10-CM | POA: Diagnosis not present

## 2017-09-18 DIAGNOSIS — Z0000 Encounter for general adult medical examination without abnormal findings: Secondary | ICD-10-CM | POA: Diagnosis not present

## 2017-09-18 DIAGNOSIS — Z1211 Encounter for screening for malignant neoplasm of colon: Secondary | ICD-10-CM

## 2017-09-18 LAB — COMPREHENSIVE METABOLIC PANEL
ALT: 17 IU/L (ref 0–32)
AST: 19 IU/L (ref 0–40)
Albumin/Globulin Ratio: 1.7 (ref 1.2–2.2)
Albumin: 4.4 g/dL (ref 3.5–5.5)
Alkaline Phosphatase: 40 IU/L (ref 39–117)
BUN / CREAT RATIO: 9 (ref 9–23)
BUN: 7 mg/dL (ref 6–24)
Bilirubin Total: 0.4 mg/dL (ref 0.0–1.2)
CALCIUM: 9.5 mg/dL (ref 8.7–10.2)
CHLORIDE: 104 mmol/L (ref 96–106)
CO2: 19 mmol/L — AB (ref 20–29)
CREATININE: 0.75 mg/dL (ref 0.57–1.00)
GFR, EST AFRICAN AMERICAN: 107 mL/min/{1.73_m2} (ref >59)
GFR, EST NON AFRICAN AMERICAN: 93 mL/min/{1.73_m2} (ref >59)
GLUCOSE: 90 mg/dL (ref 65–99)
Globulin, Total: 2.6 g/dL (ref 1.5–4.5)
POTASSIUM: 4.7 mmol/L (ref 3.5–5.2)
Sodium: 138 mmol/L (ref 134–144)
Total Protein: 7 g/dL (ref 6.0–8.5)

## 2017-09-18 LAB — LIPID PANEL
CHOL/HDL RATIO: 2.8 ratio (ref 0.0–4.4)
CHOLESTEROL TOTAL: 194 mg/dL (ref 100–199)
HDL: 69 mg/dL (ref >39)
LDL Calculated: 100 mg/dL — ABNORMAL HIGH (ref 0–99)
Triglycerides: 125 mg/dL (ref 0–149)
VLDL Cholesterol Cal: 25 mg/dL (ref 5–40)

## 2017-09-18 MED ORDER — LEVOCETIRIZINE DIHYDROCHLORIDE 2.5 MG/5ML PO SOLN: 2.5000 mg | Freq: Every evening | ORAL | 12 refills | Status: DC

## 2017-09-18 MED ORDER — CLINDAMYCIN PHOSPHATE 1 % EX FOAM: 1.0000 "application " | Freq: Two times a day (BID) | CUTANEOUS | 11 refills | Status: DC

## 2017-09-18 MED ORDER — PAROXETINE HCL 10 MG PO TABS: 10.0000 mg | ORAL_TABLET | ORAL | 3 refills | Status: DC

## 2017-09-18 NOTE — Progress Notes (Signed)
Chief Complaint  Patient presents with  . Annual Exam    CPE without pap  . Medication Refill    paxil and clindamycin phosphate 1%    Subjective:  Lori Wheeler is a 52 y.o. female here for a health maintenance visit.  Patient is established  pt  Pt reports that she would run intermittently She states that she was running/walking for 30 minutes She started having pain 3-4 weeks She states that she gets pain with going up and down the stairs but that is intermittent She has never taking anything for the pain Her pain is 7/10 but uses some ice She reports that once she stops the aggravating movement the pain goes away She does not take any nsaids She denies any buckling pain or weakness She denies swelling to the knee   Colon Cancer Screening SHe has never had a colonoscopy SHe denies blood in his stool, unexpected weight loss or pain with defecation No rectal itching SHe does not smoke SHe does not have a family history of colon cancer   Patient Active Problem List   Diagnosis Date Noted  . Generalized anxiety disorder 02/17/2015  . Right elbow pain 08/28/2012  . Lateral epicondylitis of right elbow 08/28/2012  . MUSCLE STRAIN, HAMSTRING MUSCLE 06/06/2006    Past Medical History:  Diagnosis Date  . Allergy    Claritin daily  . Anxiety     Past Surgical History:  Procedure Laterality Date  . Admission     Behavioral Health admission; 2 weeks.  Marland Kitchen MANDIBLE SURGERY  1991     Outpatient Medications Prior to Visit  Medication Sig Dispense Refill  . b complex vitamins tablet Take 1 tablet by mouth daily.    . Calcium-Magnesium-Vitamin D (CALCIUM MAGNESIUM PO) Take 1,000 mg by mouth daily.    . Multiple Vitamin (MULTIVITAMIN) tablet Take 1 tablet by mouth daily.    . norethindrone-ethinyl estradiol (JUNEL FE,GILDESS FE,LOESTRIN FE) 1-20 MG-MCG tablet Take 1 tablet by mouth daily.    Marland Kitchen VITAMIN D, CHOLECALCIFEROL, PO Take 1,000 Units by mouth daily.    .  Clindamycin Phosphate foam Apply 1 application topically 2 (two) times daily. 100 g 11  . fluticasone (FLONASE) 50 MCG/ACT nasal spray Place 2 sprays into both nostrils daily. (Patient not taking: Reported on 09/06/2016) 16 g 12  . hydrocortisone (ANUSOL-HC) 25 MG suppository Place 1 suppository (25 mg total) rectally 2 (two) times daily. (Patient not taking: Reported on 09/06/2016) 24 suppository 3  . PARoxetine (PAXIL) 10 MG tablet Take 1 tablet (10 mg total) by mouth every morning. 90 tablet 3   No facility-administered medications prior to visit.     No Known Allergies   Family History  Problem Relation Age of Onset  . Diabetes Brother   . Hypertension Brother   . Hyperlipidemia Brother   . Cancer Brother        kidney cancer  . Hyperlipidemia Brother   . Hypertension Brother   . Hyperlipidemia Brother   . Hypertension Brother   . Cancer Father        brain tumor     Health Habits: Dental Exam: up to date Eye Exam: up to date Exercise:4 times/week on average Current exercise activities: walking/running   Social History   Socioeconomic History  . Marital status: Married    Spouse name: Not on file  . Number of children: Not on file  . Years of education: Not on file  . Highest education level: Not  on file  Occupational History  . Occupation: Dance movement psychotherapist  Social Needs  . Financial resource strain: Not on file  . Food insecurity:    Worry: Not on file    Inability: Not on file  . Transportation needs:    Medical: Not on file    Non-medical: Not on file  Tobacco Use  . Smoking status: Former Smoker    Years: 1.00    Types: Cigarettes    Last attempt to quit: 01/22/1983    Years since quitting: 34.6  . Smokeless tobacco: Never Used  Substance and Sexual Activity  . Alcohol use: Yes    Comment: beer - 6 pack/week  . Drug use: Not on file  . Sexual activity: Yes    Birth control/protection: Pill  Lifestyle  . Physical activity:    Days per week: Not  on file    Minutes per session: Not on file  . Stress: Not on file  Relationships  . Social connections:    Talks on phone: Not on file    Gets together: Not on file    Attends religious service: Not on file    Active member of club or organization: Not on file    Attends meetings of clubs or organizations: Not on file    Relationship status: Not on file  . Intimate partner violence:    Fear of current or ex partner: Not on file    Emotionally abused: Not on file    Physically abused: Not on file    Forced sexual activity: Not on file  Other Topics Concern  . Not on file  Social History Narrative   Marital status:  Married x 28 years      Children: none; one dog      Lives: with husband, dog.       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      Diet:  VEGETARIAN; Tofu and beans; smoothies with protein powder; B complex   Social History   Substance and Sexual Activity  Alcohol Use Yes   Comment: beer - 6 pack/week   Social History   Tobacco Use  Smoking Status Former Smoker  . Years: 1.00  . Types: Cigarettes  . Last attempt to quit: 01/22/1983  . Years since quitting: 34.6  Smokeless Tobacco Never Used   Social History   Substance and Sexual Activity  Drug Use Not on file    GYN: Sexual Health Menstrual status: regular menses LMP: Patient's last menstrual period was 08/27/2017. Last pap smear: see HM section History of abnormal pap smears:  Sexually active: with female partner Current contraception: OCPs  Health Maintenance: See under health Maintenance activity for review of completion dates as well. Immunization History  Administered Date(s) Administered  . Td 01/21/2002  . Tdap 08/18/2012     Depression Screen-PHQ2/9 Depression screen [facility] 2/9 09/18/2017 09/06/2016 05/21/2016 01/09/2016 12/19/2015  Decreased  Interest 0 0 0 0 0  Down, Depressed, Hopeless 0 0 0 0 0  PHQ - 2 Score 0 0 0 0 0       Depression Severity and Treatment Recommendations:  0-4= None  5-9= Mild / Treatment: Support, educate to call if worse; return in one month  10-14= Moderate /  Treatment: Support, watchful waiting; Antidepressant or Psycotherapy  15-19= Moderately severe / Treatment: Antidepressant OR Psychotherapy  >= 20 = Major depression, severe / Antidepressant AND Psychotherapy    Review of Systems   Review of Systems  Constitutional: Negative for chills and fever.  HENT: Negative for ear pain, hearing loss and tinnitus.   Respiratory: Negative for cough, shortness of breath and wheezing.   Cardiovascular: Negative for chest pain and palpitations.  Gastrointestinal: Negative for abdominal pain, nausea and vomiting.  Genitourinary: Negative for dysuria and urgency.  Musculoskeletal: Positive for joint pain.  Skin: Negative for itching and rash.  Neurological: Negative for dizziness, tingling, tremors and headaches.  Psychiatric/Behavioral: Negative for depression. The patient is not nervous/anxious.     See HPI for ROS as well.    Objective:   Vitals:   09/18/17 0913  BP: 108/72  Pulse: 79  Resp: 16  Temp: 98.7 F (37.1 C)  TempSrc: Oral  SpO2: 98%  Weight: 152 lb 3.2 oz (69 kg)  Height: 5' 3.25" (1.607 m)    Body mass index is 26.75 kg/m.  Physical Exam  BP 108/72 (BP Location: Right Arm, Patient Position: Sitting, Cuff Size: Normal)   Pulse 79   Temp 98.7 F (37.1 C) (Oral)   Resp 16   Ht 5' 3.25" (1.607 m)   Wt 152 lb 3.2 oz (69 kg)   LMP 08/27/2017   SpO2 98%   BMI 26.75 kg/m   General Appearance:    Alert, cooperative, no distress, appears stated age  Head:    Normocephalic, without obvious abnormality, atraumatic  Eyes:    PERRL, conjunctiva/corneas clear, EOM's intact, fundi    benign, both eyes  Ears:    Normal TM's and external ear canals, both ears  Nose:   Nares  normal, septum midline, mucosa normal, no drainage    or sinus tenderness  Throat:   Lips, mucosa, and tongue normal; teeth and gums normal  Neck:   Supple, symmetrical, trachea midline, no adenopathy;    thyroid:  no enlargement/tenderness/nodules; no carotid   bruit or JVD  Back:     Symmetric, no curvature, ROM normal, no CVA tenderness  Lungs:     Clear to auscultation bilaterally, respirations unlabored  Chest Wall:    No tenderness or deformity   Heart:    Regular rate and rhythm, S1 and S2 normal, no murmur, rub   or gallop  Breast Exam:    No tenderness, masses, or nipple abnormality  Abdomen:     Soft, non-tender, bowel sounds active all four quadrants,    no masses, no organomegaly  Extremities:   Extremities normal, atraumatic, no cyanosis or edema Right knee without tenderness, no effusion, no edema, anterior drawer negative, lochman's negative, no clicking or crepitus  Pulses:   2+ and symmetric all extremities  Skin:   Skin color, texture, turgor normal, no rashes or lesions  Lymph nodes:   Cervical, supraclavicular, and axillary nodes normal  Neurologic:   CNII-XII intact, normal strength, sensation and reflexes    throughout      Assessment/Plan:   Patient was seen for a health maintenance exam.  Counseled the patient on health maintenance issues. Reviewed her health mainteance schedule and ordered appropriate tests (see orders.) Counseled on regular exercise and weight management. Recommend regular eye exams and dental cleaning.   The following issues were addressed today for health maintenance:   Pearson was seen today for annual exam and medication refill.  Diagnoses and  all orders for this visit:  Routine physical examination- Women's Health Maintenance Plan Advised monthly breast exam and annual mammogram Advised dental exam every six months Discussed stress management Discussed pap smear screening guidelines    Screening for colon cancer- discussed  colon cancer screening Pt agrees to colonoscopy -     Ambulatory referral to Gastroenterology  Dyslipidemia- reviewed previous lipids -     Lipid panel -     Comprehensive metabolic panel  Patellofemoral arthralgia of right knee- discussed home care with NSAIDs and icing Gave handout  Other orders -     PARoxetine (PAXIL) 10 MG tablet; Take 1 tablet (10 mg total) by mouth every morning. -     Clindamycin Phosphate foam; Apply 1 application topically 2 (two) times daily. -     levocetirizine (XYZAL) 2.5 MG/5ML solution; Take 5 mLs (2.5 mg total) by mouth every evening.    Return in about 1 year (around 09/19/2018).    Body mass index is 26.75 kg/m.:  Discussed the patient's BMI with patient. The BMI body mass index is 26.75 kg/m.     No future appointments.  Patient Instructions       If you have lab work done today you will be contacted with your lab results within the next 2 weeks.  If you have not heard from [place] then please contact us. The fastest way to get your results is to register for My Chart.   IF you received an x-ray today, you will receive an invoice from [facility]. Please contact [facility] at [phone] with questions or concerns regarding your invoice.   IF you received labwork today, you will receive an invoice from [facility]. Please contact [facility] at [phone] with questions or concerns regarding your invoice.   Our billing staff will not be able to assist you with questions regarding bills from these companies.  You will be contacted with the lab results as soon as they are available. The fastest way to get your results is to activate your My Chart account. Instructions are located on the last page of this paperwork. If you have not heard from [place] regarding the results in 2 weeks, please contact this office.     Patellofemoral Pain Syndrome Patellofemoral pain syndrome is a condition that involves a softening or breakdown of  the tissue (cartilage) on the underside of your kneecap (patella). This causes pain in the front of the knee. The condition is also called runner's knee or chondromalacia patella. Patellofemoral pain syndrome is most common in young adults who are active in sports. Your knee is the largest joint in your body. The patella covers the front of your knee and is attached to muscles above and below your knee. The underside of the patella is covered with a smooth type of cartilage (synovium). The smooth surface helps the patella glide easily when you move your knee. Patellofemoral pain syndrome causes swelling in the joint linings and bone surfaces in your knee. What are the causes? Patellofemoral pain syndrome can be caused by:  Overuse.  Poor alignment of your knee joints.  Weak leg muscles.  A direct blow to your kneecap.  What increases the risk? You may be at risk for patellofemoral pain syndrome if you:  Do a lot of activities that can wear down your kneecap. These include: ? Running. ? Squatting. ? Climbing stairs.  Start a new physical activity or exercise program.  Wear shoes that do not fit well.  Do not have  good leg strength.  Are overweight.  What are the signs or symptoms? Knee pain is the most common symptom of patellofemoral pain syndrome. This may feel like a dull, aching pain underneath your patella, in the front of your knee. There may be a popping or cracking sound when you move your knee. Pain may get worse with:  Exercise.  Climbing stairs.  Running.  Jumping.  Squatting.  Kneeling.  Sitting for a long time.  Moving or pushing on your patella.  How is this diagnosed? Your health care provider may be able to diagnose patellofemoral pain syndrome from your symptoms and medical history. You may be asked about your recent physical activities and which ones cause knee pain. Your health care provider may do a physical exam with certain tests to confirm the  diagnosis. These may include:  Moving your patella back and forth.  Checking your range of knee motion.  Having you squat or jump to see if you have pain.  Checking the strength of your leg muscles.  An MRI of the knee may also be done. How is this treated? Patellofemoral pain syndrome can usually be treated at home with rest, ice, compression, and elevation (RICE). Other treatments may include:  Nonsteroidal anti-inflammatory drugs (NSAIDs).  Physical therapy to stretch and strengthen your leg muscles.  Shoe inserts (orthotics) to take stress off your knee.  A knee brace or knee support.  Surgery to remove damaged cartilage or move the patella to a better position. The need for surgery is rare.  Follow these instructions at home:  Take medicines only as directed by your health care provider.  Rest your knee. ? When resting, keep your knee raised above the level of your heart. ? Avoid activities that cause knee pain.  Apply ice to the injured area: ? Put ice in a plastic bag. ? Place a towel between your skin and the bag. ? Leave the ice on for 20 minutes, 2-3 times a day.  Use splints, braces, knee supports, or walking aids as directed by your health care provider.  Perform stretching and strengthening exercises as directed by your health care provider or physical therapist.  Keep all follow-up visits as directed by your health care provider. This is important. Contact a health care provider if:  Your symptoms get worse.  You are not improving with home care. This information is not intended to replace advice given to you by your health care provider. Make sure you discuss any questions you have with your health care provider. Document Released: 12/26/2008 Document Revised: 06/15/2015 Document Reviewed: 03/29/2013 Elsevier Interactive Patient Education  2018 [employer].

## 2017-09-18 NOTE — Patient Instructions (Addendum)
If you have lab work done today you will be contacted with your lab results within the next 2 weeks.  If you have not heard from [place] then please contact us. The fastest way to get your results is to register for My Chart.   IF you received an x-ray today, you will receive an invoice from [facility]. Please contact [facility] at [phone] with questions or concerns regarding your invoice.   IF you received labwork today, you will receive an invoice from [place] [facility]. Please contact [facility] at [phone] with questions or concerns regarding your invoice.   Our billing staff will not be able to assist you with questions regarding bills from these companies.  You will be contacted with the lab results as soon as they are available. The fastest way to get your results is to activate your My Chart account. Instructions are located on the last page of this paperwork. If you have not heard from [place] regarding the results in 2 weeks, please contact this office.     Patellofemoral Pain Syndrome Patellofemoral pain syndrome is a condition that involves a softening or breakdown of the tissue (cartilage) on the underside of your kneecap (patella). This causes pain in the front of the knee. The condition is also called runner's knee or chondromalacia patella. Patellofemoral pain syndrome is most common in young adults who are active in sports. Your knee is the largest joint in your body. The patella covers the front of your knee and is attached to muscles above and below your knee. The underside of the patella is covered with a smooth type of cartilage (synovium). The smooth surface helps the patella glide easily when you move your knee. Patellofemoral pain syndrome causes swelling in the joint linings and bone surfaces in your knee. What are the causes? Patellofemoral pain syndrome can be caused by:  Overuse.  Poor alignment of your knee joints.  Weak leg muscles.  A direct  blow to your kneecap.  What increases the risk? You may be at risk for patellofemoral pain syndrome if you:  Do a lot of activities that can wear down your kneecap. These include: ? Running. ? Squatting. ? Climbing stairs.  Start a new physical activity or exercise program.  Wear shoes that do not fit well.  Do not have good leg strength.  Are overweight.  What are the signs or symptoms? Knee pain is the most common symptom of patellofemoral pain syndrome. This may feel like a dull, aching pain underneath your patella, in the front of your knee. There may be a popping or cracking sound when you move your knee. Pain may get worse with:  Exercise.  Climbing stairs.  Running.  Jumping.  Squatting.  Kneeling.  Sitting for a long time.  Moving or pushing on your patella.  How is this diagnosed? Your health care provider may be able to diagnose patellofemoral pain syndrome from your symptoms and medical history. You may be asked about your recent physical activities and which ones cause knee pain. Your health care provider may do a physical exam with certain tests to confirm the diagnosis. These may include:  Moving your patella back and forth.  Checking your range of knee motion.  Having you squat or jump to see if you have pain.  Checking the strength of your leg muscles.  An MRI of the knee may also be done. How is this treated? Patellofemoral pain syndrome can usually be treated at home with  rest, ice, compression, and elevation (RICE). Other treatments may include:  Nonsteroidal anti-inflammatory drugs (NSAIDs).  Physical therapy to stretch and strengthen your leg muscles.  Shoe inserts (orthotics) to take stress off your knee.  A knee brace or knee support.  Surgery to remove damaged cartilage or move the patella to a better position. The need for surgery is rare.  Follow these instructions at home:  Take medicines only as directed by your health care  provider.  Rest your knee. ? When resting, keep your knee raised above the level of your heart. ? Avoid activities that cause knee pain.  Apply ice to the injured area: ? Put ice in a plastic bag. ? Place a towel between your skin and the bag. ? Leave the ice on for 20 minutes, 2-3 times a day.  Use splints, braces, knee supports, or walking aids as directed by your health care provider.  Perform stretching and strengthening exercises as directed by your health care provider or physical therapist.  Keep all follow-up visits as directed by your health care provider. This is important. Contact a health care provider if:  Your symptoms get worse.  You are not improving with home care. This information is not intended to replace advice given to you by your health care provider. Make sure you discuss any questions you have with your health care provider. Document Released: 12/26/2008 Document Revised: 06/15/2015 Document Reviewed: 03/29/2013 Elsevier Interactive Patient Education  2018 [employer].

## 2017-09-19 ENCOUNTER — Encounter: Payer: Self-pay | Admitting: Gastroenterology

## 2017-10-06 ENCOUNTER — Telehealth: Payer: Self-pay | Admitting: Family Medicine

## 2017-10-06 DIAGNOSIS — M25561 Pain in right knee: Secondary | ICD-10-CM

## 2017-10-06 NOTE — Telephone Encounter (Signed)
Copied from [place] [id number]. Topic: Referral - Request >> Oct 06, 2017  8:09 AM Alfredia Ferguson R wrote: Pt is calling in requesting an referral to a physical therapist due to having knee pain. She is requesting to be sent to below facility  [facility],[place] [address] [phone] Fax- [phone]

## 2017-10-07 NOTE — Telephone Encounter (Signed)
Please see note below. 

## 2017-10-09 NOTE — Telephone Encounter (Signed)
Done

## 2017-10-09 NOTE — Telephone Encounter (Signed)
Pt following up on PT request.  Pt states Dr Nolon Rod saw her for her knee issue. Please advise

## 2017-10-09 NOTE — Telephone Encounter (Signed)
Referral has been placed for PT

## 2017-11-06 ENCOUNTER — Encounter: Payer: Self-pay | Admitting: Gastroenterology

## 2017-11-06 ENCOUNTER — Ambulatory Visit (AMBULATORY_SURGERY_CENTER): Payer: Self-pay

## 2017-11-06 ENCOUNTER — Other Ambulatory Visit: Payer: Self-pay

## 2017-11-06 VITALS — Ht 63.5 in | Wt 158.0 lb

## 2017-11-06 DIAGNOSIS — Z1211 Encounter for screening for malignant neoplasm of colon: Secondary | ICD-10-CM

## 2017-11-06 MED ORDER — NA SULFATE-K SULFATE-MG SULF 17.5-3.13-1.6 GM/177ML PO SOLN: 1.0000 | Freq: Once | ORAL | 0 refills | Status: AC

## 2017-11-06 NOTE — Progress Notes (Signed)
No egg or soy allergy known to patient  No issues with past sedation with any surgeries  or procedures, no intubation problems  No diet pills per patient No home 02 use per patient  No blood thinners per patient  Pt denies issues with constipation  No A fib or A flutter  EMMI video sent to pt's e mail  

## 2017-11-12 ENCOUNTER — Ambulatory Visit: Payer: BC Managed Care – PPO | Admitting: Family Medicine

## 2017-11-19 ENCOUNTER — Other Ambulatory Visit: Payer: Self-pay

## 2017-11-19 ENCOUNTER — Telehealth: Payer: Self-pay | Admitting: Gastroenterology

## 2017-11-19 ENCOUNTER — Encounter: Payer: Self-pay | Admitting: Emergency Medicine

## 2017-11-19 ENCOUNTER — Ambulatory Visit: Payer: BC Managed Care – PPO | Admitting: Family Medicine

## 2017-11-19 ENCOUNTER — Ambulatory Visit: Payer: BC Managed Care – PPO | Admitting: Emergency Medicine

## 2017-11-19 VITALS — BP 132/86 | HR 83 | Temp 98.5°F | Resp 20 | Ht 62.68 in | Wt 155.4 lb

## 2017-11-19 DIAGNOSIS — R05 Cough: Secondary | ICD-10-CM

## 2017-11-19 DIAGNOSIS — R52 Pain, unspecified: Secondary | ICD-10-CM | POA: Diagnosis not present

## 2017-11-19 DIAGNOSIS — J029 Acute pharyngitis, unspecified: Secondary | ICD-10-CM

## 2017-11-19 DIAGNOSIS — B9789 Other viral agents as the cause of diseases classified elsewhere: Secondary | ICD-10-CM

## 2017-11-19 DIAGNOSIS — R059 Cough, unspecified: Secondary | ICD-10-CM

## 2017-11-19 DIAGNOSIS — J988 Other specified respiratory disorders: Secondary | ICD-10-CM | POA: Diagnosis not present

## 2017-11-19 LAB — POCT INFLUENZA A/B
INFLUENZA A, POC: NEGATIVE
Influenza B, POC: NEGATIVE

## 2017-11-19 MED ORDER — BENZONATATE 200 MG PO CAPS: 200.0000 mg | ORAL_CAPSULE | Freq: Two times a day (BID) | ORAL | 0 refills | Status: DC | PRN

## 2017-11-19 MED ORDER — PROMETHAZINE-CODEINE 6.25-10 MG/5ML PO SYRP: 5.0000 mL | ORAL_SOLUTION | Freq: Every evening | ORAL | 0 refills | Status: DC | PRN

## 2017-11-19 NOTE — Progress Notes (Signed)
Lori Wheeler 52 y.o.   Chief Complaint  Patient presents with  . Cough    X 2 days  . Sore Throat    X 3 days  . Generalized Body Aches    X 1.5 days    HISTORY OF PRESENT ILLNESS: This is a 52 y.o. female complaining of flulike symptoms that started 2 days ago.  Husband also sick with the same.  It started on Monday with a sore throat followed by fever and chills and a nonproductive cough.  Has some achiness but no other significant symptoms.  HPI   Prior to Admission medications   Medication Sig Start Date End Date Taking? Authorizing Provider  b complex vitamins tablet Take 1 tablet by mouth daily.   Yes [provider]  Calcium-Magnesium-Vitamin D (CALCIUM MAGNESIUM PO) Take 1,000 mg by mouth daily.   Yes [provider]  Clindamycin Phosphate foam Apply 1 application topically 2 (two) times daily. 09/18/17  Yes Forrest Moron, MD  Multiple Vitamin (MULTIVITAMIN) tablet Take 1 tablet by mouth daily.   Yes [provider]  norethindrone-ethinyl estradiol (JUNEL FE,GILDESS FE,LOESTRIN FE) 1-20 MG-MCG tablet Take 1 tablet by mouth daily.   Yes [provider]  PARoxetine (PAXIL) 10 MG tablet Take 1 tablet (10 mg total) by mouth every morning. 09/18/17  Yes Stallings, Zoe A, MD  VITAMIN D, CHOLECALCIFEROL, PO Take 1,000 Units by mouth daily.   Yes [provider]    No Known Allergies  Patient Active Problem List   Diagnosis Date Noted  . Generalized anxiety disorder 02/17/2015  . Right elbow pain 08/28/2012  . MUSCLE STRAIN, HAMSTRING MUSCLE 06/06/2006    Past Medical History:  Diagnosis Date  . Allergy    Claritin daily  . Anxiety     Past Surgical History:  Procedure Laterality Date  . Admission     Behavioral Health admission; 2 weeks.  Marland Kitchen MANDIBLE SURGERY  1991    Social History   Socioeconomic History  . Marital status: Married    Spouse name: Not on file  . Number of children: 0  . Years of education:  Not on file  . Highest education level: Not on file  Occupational History  . Occupation: Dance movement psychotherapist  Social Needs  . Financial resource strain: Not on file  . Food insecurity:    Worry: Not on file    Inability: Not on file  . Transportation needs:    Medical: Not on file    Non-medical: Not on file  Tobacco Use  . Smoking status: Former Smoker    Years: 1.00    Types: Cigarettes    Last attempt to quit: 01/22/1983    Years since quitting: 34.8  . Smokeless tobacco: Never Used  Substance and Sexual Activity  . Alcohol use: Yes    Comment: beer - 6 pack/week  . Drug use: Never  . Sexual activity: Yes    Birth control/protection: Pill  Lifestyle  . Physical activity:    Days per week: Not on file    Minutes per session: Not on file  . Stress: Not on file  Relationships  . Social connections:    Talks on phone: Not on file    Gets together: Not on file    Attends religious service: Not on file    Active member of club or organization: Not on file    Attends meetings of clubs or organizations: Not on file    Relationship status:  Not on file  . Intimate partner violence:    Fear of current or ex partner: Not on file    Emotionally abused: Not on file    Physically abused: Not on file    Forced sexual activity: Not on file  Other Topics Concern  . Not on file  Social History Narrative   Marital status:  Married x 28 years      Children: none; one dog      Lives: with husband, dog.       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      Diet:  VEGETARIAN; Tofu and beans; smoothies with protein powder; B complex    Family History  Problem Relation Age of Onset  . Diabetes Brother   . Hypertension Brother   . Hyperlipidemia Brother   . Cancer Brother        kidney cancer  . Hyperlipidemia Brother   . Hypertension  Brother   . Hyperlipidemia Brother   . Hypertension Brother   . Cancer Father        brain tumor  . Colon cancer Neg Hx   . Esophageal cancer Neg Hx   . Rectal cancer Neg Hx   . Stomach cancer Neg Hx      Review of Systems  Constitutional: Negative.  Negative for chills and fever.  HENT: Positive for sore throat.   Eyes: Negative.  Negative for blurred vision, double vision, discharge and redness.  Respiratory: Positive for cough. Negative for shortness of breath.   Cardiovascular: Negative.  Negative for chest pain and palpitations.  Gastrointestinal: Negative.  Negative for abdominal pain, diarrhea, nausea and vomiting.  Genitourinary: Negative.   Musculoskeletal: Positive for myalgias.  Skin: Negative.  Negative for rash.  Neurological: Negative.  Negative for dizziness and headaches.  Endo/Heme/Allergies: Negative.   All other systems reviewed and are negative.   Vitals:   11/19/17 1006  BP: 132/86  Pulse: 83  Resp: 20  Temp: 98.5 F (36.9 C)  SpO2: 97%    Physical Exam  Constitutional: She is oriented to person, place, and time. She appears well-developed and well-nourished.  HENT:  Head: Normocephalic and atraumatic.  Right Ear: External ear normal.  Left Ear: External ear normal.  Nose: Nose normal.  Mouth/Throat: Oropharynx is clear and moist.  Eyes: Pupils are equal, round, and reactive to light. Conjunctivae and EOM are normal.  Neck: Normal range of motion. Neck supple. No thyromegaly present.  Cardiovascular: Normal rate, regular rhythm and normal heart sounds.  Pulmonary/Chest: Effort normal and breath sounds normal.  Musculoskeletal: Normal range of motion.  Lymphadenopathy:    She has no cervical adenopathy.  Neurological: She is alert and oriented to person, place, and time. No sensory deficit. She exhibits normal muscle tone.  Skin: Skin is warm and dry. Capillary refill takes less than 2 seconds.  Psychiatric: She has a normal mood and affect.  Her behavior is normal.  Vitals reviewed.  Results for orders placed or performed in visit on 11/19/17 (from the past 24 hour(s))  POCT Influenza A/B     Status: None   Collection Time: 11/19/17 10:32 AM  Result Value Ref Range   Influenza A, POC Negative Negative   Influenza B, POC  Negative Negative     ASSESSMENT & PLAN: Shiara was seen today for cough, sore throat and generalized body aches.  Diagnoses and all orders for this visit:  Cough -     benzonatate (TESSALON) 200 MG capsule; Take 1 capsule (200 mg total) by mouth 2 (two) times daily as needed for cough. -     Discontinue: promethazine-codeine (PHENERGAN WITH CODEINE) 6.25-10 MG/5ML syrup; Take 5 mLs by mouth at bedtime as needed for cough. -     promethazine-codeine (PHENERGAN WITH CODEINE) 6.25-10 MG/5ML syrup; Take 5 mLs by mouth at bedtime as needed for cough.  Viral respiratory infection  Sore throat  Generalized body aches -     POCT Influenza A/B    Patient Instructions       If you have lab work done today you will be contacted with your lab results within the next 2 weeks.  If you have not heard from [place] then please contact us. The fastest way to get your results is to register for My Chart.   IF you received an x-ray today, you will receive an invoice from [facility]. Please contact [facility] at [phone] with questions or concerns regarding your invoice.   IF you received labwork today, you will receive an invoice from [facility]. Please contact [facility] at [phone] with questions or concerns regarding your invoice.   Our billing staff will not be able to assist you with questions regarding bills from these companies.  You will be contacted with the lab results as soon as they are available. The fastest way to get your results is to activate your My Chart account. Instructions are located on the last page of this paperwork. If you have not heard from [place] regarding the  results in 2 weeks, please contact this office.     Cough, Adult A cough helps to clear your throat and lungs. A cough may last only 2-3 weeks (acute), or it may last longer than 8 weeks (chronic). Many different things can cause a cough. A cough may be a sign of an illness or another medical condition. Follow these instructions at home:  Pay attention to any changes in your cough.  Take medicines only as told by your doctor. ? If you were prescribed an antibiotic medicine, take it as told by your doctor. Do not stop taking it even if you start to feel better. ? Talk with your doctor before you try using a cough medicine.  Drink enough fluid to keep your pee (urine) clear or pale yellow.  If the air is dry, use a cold steam vaporizer or humidifier in your home.  Stay away from things that make you cough at work or at home.  If your cough is worse at night, try using extra pillows to raise your head up higher while you sleep.  Do not smoke, and try not to be around smoke. If you need help quitting, ask your doctor.  Do not have caffeine.  Do not drink alcohol.  Rest as needed. Contact a doctor if:  You have new problems (symptoms).  You cough up yellow fluid (pus).  Your cough does not get better after 2-3 weeks, or your cough gets worse.  Medicine does not help your cough and you are not sleeping well.  You have pain that gets worse or pain that is not helped with medicine.  You have a fever.  You are losing weight and you do not know why.  You have  night sweats. Get help right away if:  You cough up blood.  You have trouble breathing.  Your heartbeat is very fast. This information is not intended to replace advice given to you by your health care provider. Make sure you discuss any questions you have with your health care provider. Document Released: 09/20/2010 Document Revised: 06/15/2015 Document Reviewed: 03/16/2014 Elsevier Interactive Patient Education   2018 Elsevier Inc.      Agustina Caroli, MD [facility]

## 2017-11-19 NOTE — Telephone Encounter (Signed)
Ok, thank you

## 2017-11-19 NOTE — Patient Instructions (Addendum)
     If you have lab work done today you will be contacted with your lab results within the next 2 weeks.  If you have not heard from us then please contact us. The fastest way to get your results is to register for My Chart.   IF you received an x-ray today, you will receive an invoice from [facility]. Please contact [facility] at [phone] with questions or concerns regarding your invoice.   IF you received labwork today, you will receive an invoice from [facility]. Please contact [facility] at [phone] with questions or concerns regarding your invoice.   Our billing staff will not be able to assist you with questions regarding bills from these companies.  You will be contacted with the lab results as soon as they are available. The fastest way to get your results is to activate your My Chart account. Instructions are located on the last page of this paperwork. If you have not heard from us regarding the results in 2 weeks, please contact this office.     Cough, Adult A cough helps to clear your throat and lungs. A cough may last only 2-3 weeks (acute), or it may last longer than 8 weeks (chronic). Many different things can cause a cough. A cough may be a sign of an illness or another medical condition. Follow these instructions at home:  Pay attention to any changes in your cough.  Take medicines only as told by your doctor. ? If you were prescribed an antibiotic medicine, take it as told by your doctor. Do not stop taking it even if you start to feel better. ? Talk with your doctor before you try using a cough medicine.  Drink enough fluid to keep your pee (urine) clear or pale yellow.  If the air is dry, use a cold steam vaporizer or humidifier in your home.  Stay away from things that make you cough at work or at home.  If your cough is worse at night, try using extra pillows to raise your head up higher while you sleep.  Do not smoke, and try not to be  around smoke. If you need help quitting, ask your doctor.  Do not have caffeine.  Do not drink alcohol.  Rest as needed. Contact a doctor if:  You have new problems (symptoms).  You cough up yellow fluid (pus).  Your cough does not get better after 2-3 weeks, or your cough gets worse.  Medicine does not help your cough and you are not sleeping well.  You have pain that gets worse or pain that is not helped with medicine.  You have a fever.  You are losing weight and you do not know why.  You have night sweats. Get help right away if:  You cough up blood.  You have trouble breathing.  Your heartbeat is very fast. This information is not intended to replace advice given to you by your health care provider. Make sure you discuss any questions you have with your health care provider. Document Released: 09/20/2010 Document Revised: 06/15/2015 Document Reviewed: 03/16/2014 Elsevier Interactive Patient Education  2018 Elsevier Inc.  

## 2017-11-20 ENCOUNTER — Telehealth: Payer: Self-pay | Admitting: Family Medicine

## 2017-11-20 ENCOUNTER — Other Ambulatory Visit: Payer: Self-pay

## 2017-11-20 ENCOUNTER — Other Ambulatory Visit: Payer: Self-pay | Admitting: Physician Assistant

## 2017-11-20 ENCOUNTER — Encounter: Payer: BC Managed Care – PPO | Admitting: Gastroenterology

## 2017-11-20 DIAGNOSIS — R05 Cough: Secondary | ICD-10-CM

## 2017-11-20 DIAGNOSIS — R059 Cough, unspecified: Secondary | ICD-10-CM

## 2017-11-20 MED ORDER — PROMETHAZINE-CODEINE 6.25-10 MG/5ML PO SYRP: 5.0000 mL | ORAL_SOLUTION | Freq: Every evening | ORAL | 0 refills | Status: DC | PRN

## 2017-11-20 NOTE — Telephone Encounter (Signed)
[place] resent med-Phenegran w/codeine to [facility] in Dr. Barry Brunner absence.

## 2017-11-20 NOTE — Telephone Encounter (Signed)
Copied from [place] [phone]. Topic: Quick Communication - Rx Refill/Question >> Nov 20, 2017 12:49 PM Leward Quan A wrote: Medication: promethazine-codeine (PHENERGAN WITH CODEINE) 6.25-10 MG/5ML syrup  [facility] but [facility]'t [facility] and need a new Rx sent to [facility] (Not A Refill)  Has the patient contacted their pharmacy? Yes.     Preferred Pharmacy (with phone number or street name): [id number] - [place], [place] - [address] [phone] (Phone) [phone] (Fax)    Agent: Please be advised that RX refills may take up to 3 business days. We ask that you follow-up with your pharmacy.

## 2017-11-20 NOTE — Progress Notes (Signed)
Meds ordered this encounter  Medications  . promethazine-codeine (PHENERGAN WITH CODEINE) 6.25-10 MG/5ML syrup    Sig: Take 5 mLs by mouth at bedtime as needed for cough.    Dispense:  120 mL    Refill:  0    [facility] to [facility] from [facility] due [facility] of [facility]    Order Specific Question:   Supervising Provider    Answer:   Horald Pollen [phone]

## 2017-11-20 NOTE — Telephone Encounter (Signed)
Patient seen in the office yesterday by Dr. Mitchel Honour and [facility], [facility] and [facility].  [id number] [address], [place] [phone] (Phone) [phone] (Fax)

## 2017-11-26 ENCOUNTER — Ambulatory Visit: Payer: BC Managed Care – PPO | Admitting: Family Medicine

## 2017-12-03 ENCOUNTER — Ambulatory Visit: Payer: BC Managed Care – PPO | Admitting: Family Medicine

## 2017-12-09 ENCOUNTER — Encounter: Payer: BC Managed Care – PPO | Admitting: Gastroenterology

## 2017-12-10 ENCOUNTER — Ambulatory Visit: Payer: BC Managed Care – PPO | Admitting: Family Medicine

## 2017-12-10 ENCOUNTER — Encounter: Payer: Self-pay | Admitting: Family Medicine

## 2017-12-10 VITALS — BP 104/70 | Ht 62.5 in | Wt 153.0 lb

## 2017-12-10 DIAGNOSIS — M25561 Pain in right knee: Secondary | ICD-10-CM | POA: Diagnosis not present

## 2017-12-10 DIAGNOSIS — G8929 Other chronic pain: Secondary | ICD-10-CM

## 2017-12-10 NOTE — Progress Notes (Signed)
PCP: Forrest Moron, MD  Subjective:   HPI: Patient is a 52 y.o. female here for right knee pain.  Patient reports she's an off and on runner but more recently has been trying to get into this more. She would run 1-2 times a week and only 2-3 miles at a time. A few months ago when visiting family in [place] she ran two days in a row. No acute issue or injury with this but when she returned home she had pain anterior right knee worse with stairs, pain localized below patella, behind this, and superior to it. Hasn't run in 3 months due to this. Pain gets up to 8/10 and sharp. Tried physical therapy for 1 visit and has done home exercises. Not tried brace, medicines. Icing only helped temporarily. No skin changes, numbness.   Past Medical History:  Diagnosis Date  . Allergy    Claritin daily  . Anxiety     Current Outpatient Medications on File Prior to Visit  Medication Sig Dispense Refill  . b complex vitamins tablet Take 1 tablet by mouth daily.    . Calcium-Magnesium-Vitamin D (CALCIUM MAGNESIUM PO) Take 1,000 mg by mouth daily.    . Clindamycin Phosphate foam Apply 1 application topically 2 (two) times daily. 100 g 11  . Multiple Vitamin (MULTIVITAMIN) tablet Take 1 tablet by mouth daily.    . norethindrone-ethinyl estradiol (JUNEL FE,GILDESS FE,LOESTRIN FE) 1-20 MG-MCG tablet Take 1 tablet by mouth daily.    Marland Kitchen PARoxetine (PAXIL) 10 MG tablet Take 1 tablet (10 mg total) by mouth every morning. 90 tablet 3  . VITAMIN D, CHOLECALCIFEROL, PO Take 1,000 Units by mouth daily.     No current facility-administered medications on file prior to visit.     Past Surgical History:  Procedure Laterality Date  . Admission     Behavioral Health admission; 2 weeks.  Marland Kitchen MANDIBLE SURGERY  1991    No Known Allergies  Social History   Socioeconomic History  . Marital status: Married    Spouse name: Not on file  . Number of children: 0  . Years of education: Not on file  .  Highest education level: Not on file  Occupational History  . Occupation: Dance movement psychotherapist  Social Needs  . Financial resource strain: Not on file  . Food insecurity:    Worry: Not on file    Inability: Not on file  . Transportation needs:    Medical: Not on file    Non-medical: Not on file  Tobacco Use  . Smoking status: Former Smoker    Years: 1.00    Types: Cigarettes    Last attempt to quit: 01/22/1983    Years since quitting: 34.9  . Smokeless tobacco: Never Used  Substance and Sexual Activity  . Alcohol use: Yes    Comment: beer - 6 pack/week  . Drug use: Never  . Sexual activity: Yes    Birth control/protection: Pill  Lifestyle  . Physical activity:    Days per week: Not on file    Minutes per session: Not on file  . Stress: Not on file  Relationships  . Social connections:    Talks on phone: Not on file    Gets together: Not on file    Attends religious service: Not on file    Active member of club or organization: Not on file    Attends meetings of clubs or organizations: Not on file    Relationship status: Not on  file  . Intimate partner violence:    Fear of current or ex partner: Not on file    Emotionally abused: Not on file    Physically abused: Not on file    Forced sexual activity: Not on file  Other Topics Concern  . Not on file  Social History Narrative   Marital status:  Married x 28 years      Children: none; one dog      Lives: with husband, dog.       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      Diet:  VEGETARIAN; Tofu and beans; smoothies with protein powder; B complex    Family History  Problem Relation Age of Onset  . Diabetes Brother   . Hypertension Brother   . Hyperlipidemia Brother   . Cancer Brother        kidney cancer  . Hyperlipidemia Brother   . Hypertension Brother   .  Hyperlipidemia Brother   . Hypertension Brother   . Cancer Father        brain tumor  . Colon cancer Neg Hx   . Esophageal cancer Neg Hx   . Rectal cancer Neg Hx   . Stomach cancer Neg Hx     BP 104/70   Ht 5' 2.5" (1.588 m)   Wt 153 lb (69.4 kg)   LMP 11/17/2017   BMI 27.54 kg/m   Review of Systems: See HPI above.     Objective:  Physical Exam:  Gen: NAD, comfortable in exam room  Right knee: Mod pronation long arches.  VMO atrophy.  No gross deformity, ecchymoses, effusion. No TTP currently FROM with 5/5 strength flexion and extension.  5-/5 hip abduction. Negative ant/post drawers. Negative valgus/varus testing. Negative lachmans. Negative mcmurrays, apleys, patellar apprehension. NV intact distally.  Left knee: No deformity. FROM with 5/5 strength. No tenderness to palpation. NVI distally.   Assessment & Plan:  1. Right knee pain - 2/2 patellar tendinitis and patellofemoral syndrome.  Reviewed home exercise program to do daily.  Icing, aleve or ibuprofen.  Patellar tendon strap.  Arch support.  Consider more extensive physical therapy, nitro patches if not improving.  F/u in 6 weeks.

## 2017-12-10 NOTE — Patient Instructions (Addendum)
You have patellofemoral syndrome and patellar tendinitis. Ice area 15 minutes at a time 3-4 times a day as needed. Aleve 2 tabs twice a day with food OR ibuprofen 600mg  three times a day with food for pain and inflammation as needed. Straight leg raises, straight leg raises with foot turned outwards, hip side raises, knee extensions, decline squats 3 sets of 10 once a day. Patellar tendon strap when up and walking around. Correct foot breakdown with something like dr. Zoe Lan active series, spencos, superfeet, or our green sports insoles. Consider physical therapy. Consider nitro patches if not improving.   Follow up with me in 6 weeks. Activities as tolerated - I would avoid an activity (if possible) if pain worse than 3/10 level. Have a dumbwaiter installed in your house.

## 2018-01-28 ENCOUNTER — Ambulatory Visit: Payer: BC Managed Care – PPO | Admitting: Family Medicine

## 2018-01-29 ENCOUNTER — Ambulatory Visit: Payer: BC Managed Care – PPO | Admitting: Family Medicine

## 2018-02-17 ENCOUNTER — Ambulatory Visit: Payer: BC Managed Care – PPO | Admitting: Family Medicine

## 2018-03-31 ENCOUNTER — Encounter: Payer: Self-pay | Admitting: Gastroenterology

## 2018-04-29 ENCOUNTER — Other Ambulatory Visit: Payer: Self-pay

## 2018-04-29 ENCOUNTER — Ambulatory Visit (AMBULATORY_SURGERY_CENTER): Payer: BC Managed Care – PPO | Admitting: *Deleted

## 2018-04-29 VITALS — Ht 63.0 in | Wt 160.0 lb

## 2018-04-29 DIAGNOSIS — Z1211 Encounter for screening for malignant neoplasm of colon: Secondary | ICD-10-CM

## 2018-04-29 MED ORDER — NA SULFATE-K SULFATE-MG SULF 17.5-3.13-1.6 GM/177ML PO SOLN: 1.0000 | Freq: Once | ORAL | 0 refills | Status: AC

## 2018-04-29 NOTE — Progress Notes (Signed)
Pt mailed instruction packet to included paper to complete and mail back to [facility] with addressed and stamped envelope, Emmi video, copy of consent form to read and not return, and instructions. Suprep at home from last PV/ colon- has not expired per pt- - . PV completed over the phone. Pt encouraged to call with questions or issues   No egg or soy allergy known to patient  No issues with past sedation with any surgeries  or procedures, no intubation problems  No diet pills per patient No home 02 use per patient  No blood thinners per patient  Pt denies issues with constipation  No A fib or A flutter  EMMI video sent to pt's e mail  Pt to email front/back insurance card to [email or web address]

## 2018-05-07 ENCOUNTER — Encounter: Payer: BC Managed Care – PPO | Admitting: Gastroenterology

## 2018-05-25 ENCOUNTER — Telehealth: Payer: Self-pay | Admitting: *Deleted

## 2018-05-25 NOTE — Telephone Encounter (Signed)
Called pt, no answer, left message for her to call us back to reschedule colonoscopy. Dr.Cirigliano is not here on 5/19.

## 2018-05-26 ENCOUNTER — Encounter: Payer: Self-pay | Admitting: Gastroenterology

## 2018-06-03 ENCOUNTER — Encounter: Payer: BC Managed Care – PPO | Admitting: Gastroenterology

## 2018-06-09 ENCOUNTER — Encounter: Payer: BC Managed Care – PPO | Admitting: Gastroenterology

## 2018-06-10 ENCOUNTER — Telehealth: Payer: Self-pay | Admitting: *Deleted

## 2018-06-10 NOTE — Telephone Encounter (Signed)
Covid-19 travel screening questions  Have you traveled in the last 14 days?no If yes where?  Do you now or have you had a fever in the last 14 days?no  Do you have any respiratory symptoms of shortness of breath or cough now or in the last 14 days?no  Do you have a medical history of Congestive Heart Failure?  Do you have a medical history of lung disease?  Do you have any family members or close contacts with diagnosed or suspected Covid-19?no   Pt aware of care partner policy and will bring a mask if she has one available. SM

## 2018-06-12 ENCOUNTER — Encounter: Payer: Self-pay | Admitting: Gastroenterology

## 2018-06-12 ENCOUNTER — Other Ambulatory Visit: Payer: Self-pay

## 2018-06-12 ENCOUNTER — Ambulatory Visit (AMBULATORY_SURGERY_CENTER): Payer: BC Managed Care – PPO | Admitting: Gastroenterology

## 2018-06-12 VITALS — BP 126/82 | HR 67 | Temp 99.0°F | Resp 16 | Ht 62.5 in | Wt 153.0 lb

## 2018-06-12 DIAGNOSIS — D125 Benign neoplasm of sigmoid colon: Secondary | ICD-10-CM

## 2018-06-12 DIAGNOSIS — K640 First degree hemorrhoids: Secondary | ICD-10-CM

## 2018-06-12 DIAGNOSIS — D123 Benign neoplasm of transverse colon: Secondary | ICD-10-CM | POA: Diagnosis not present

## 2018-06-12 DIAGNOSIS — Z1211 Encounter for screening for malignant neoplasm of colon: Secondary | ICD-10-CM

## 2018-06-12 DIAGNOSIS — D122 Benign neoplasm of ascending colon: Secondary | ICD-10-CM

## 2018-06-12 MED ORDER — SODIUM CHLORIDE 0.9 % IV SOLN: 500.0000 mL | Freq: Once | INTRAVENOUS | Status: DC

## 2018-06-12 NOTE — Progress Notes (Signed)
Called to room to assist during endoscopic procedure.  Patient ID and intended procedure confirmed with present staff. Received instructions for my participation in the procedure from the performing physician.  

## 2018-06-12 NOTE — Op Note (Signed)
[place] Patient Name: Lori Wheeler Procedure Date: 06/12/2018 8:40 AM MRN: [id number] Endoscopist: Gerrit Heck , MD Age: 53 Referring MD:  Date of Birth: May 01, 1965 Gender: Female Account #: [id number] Procedure:                Colonoscopy Indications:              Screening for colorectal malignant neoplasm, This                            is the patient's first colonoscopy Medicines:                Monitored Anesthesia Care Procedure:                Pre-Anesthesia Assessment:                           - Prior to the procedure, a History and Physical                            was performed, and patient medications and                            allergies were reviewed. The patient's tolerance of                            previous anesthesia was also reviewed. The risks                            and benefits of the procedure and the sedation                            options and risks were discussed with the patient.                            All questions were answered, and informed consent                            was obtained. Prior Anticoagulants: The patient has                            taken no previous anticoagulant or antiplatelet                            agents. ASA Grade Assessment: II - A patient with                            mild systemic disease. After reviewing the risks                            and benefits, the patient was deemed in                            satisfactory condition to undergo the procedure.  After obtaining informed consent, the colonoscope                            was passed under direct vision. Throughout the                            procedure, the patient's blood pressure, pulse, and                            oxygen saturations were monitored continuously. The                            Model CF-HQ190L [id number]) scope was introduced                            through the anus and  advanced to the the terminal                            ileum. The colonoscopy was performed without                            difficulty. The patient tolerated the procedure                            well. The quality of the bowel preparation was                            adequate. The terminal ileum, ileocecal valve,                            appendiceal orifice, and rectum were photographed. Scope In: 8:51:01 AM Scope Out: 9:05:25 AM Scope Withdrawal Time: 0 hours 10 minutes 8 seconds  Total Procedure Duration: 0 hours 14 minutes 24 seconds  Findings:                 The perianal and digital rectal examinations were                            normal.                           Three sessile polyps were found in the sigmoid                            colon and transverse colon. The polyps were 3 to 6                            mm in size. These polyps were removed with a cold                            snare. Resection and retrieval were complete.                            Estimated blood loss was minimal.  A 8 mm polyp was found in the ascending colon. The                            polyp was sessile. The polyp was removed with a                            cold snare. Resection and retrieval were complete.                            Estimated blood loss was minimal.                           Non-bleeding internal hemorrhoids were found during                            retroflexion. The hemorrhoids were small.                           The terminal ileum appeared normal.                           Retroflexion in the right colon was performed. Complications:            No immediate complications. Estimated Blood Loss:     Estimated blood loss was minimal. Impression:               - Three 3 to 6 mm polyps in the sigmoid colon and                            in the transverse colon, removed with a cold snare.                            Resected and  retrieved.                           - One 8 mm polyp in the ascending colon, removed                            with a cold snare. Resected and retrieved.                           - Non-bleeding internal hemorrhoids.                           - The examined portion of the ileum was normal. Recommendation:           - Patient has a contact number available for                            emergencies. The signs and symptoms of potential                            delayed complications were discussed with the  patient. Return to normal activities tomorrow.                            Written discharge instructions were provided to the                            patient.                           - Resume previous diet today.                           - Continue present medications.                           - Use fiber, for example Citrucel, Fibercon, Konsyl                            or Metamucil.                           - Repeat colonoscopy for surveillance based on                            pathology results.                           - Return to GI clinic PRN. Gerrit Heck, MD 06/12/2018 9:10:49 AM

## 2018-06-12 NOTE — Progress Notes (Signed)
History reviewed today  Temp and Covid screening-[place]. [place], [place] per Lorrin Goodell.

## 2018-06-12 NOTE — Progress Notes (Signed)
Report given to PACU, vss 

## 2018-06-12 NOTE — Patient Instructions (Signed)
Information on polyps and hemorrhoids given to you today.  Await pathology results.  Use fiber like Citrucil, Metamucil, Fibercon or Konsyl  YOU HAD AN ENDOSCOPIC PROCEDURE TODAY AT [facility]:   Refer to the procedure report that was given to you for any specific questions about what was found during the examination.  If the procedure report does not answer your questions, please call your gastroenterologist to clarify.  If you requested that your care partner not be given the details of your procedure findings, then the procedure report has been included in a sealed envelope for you to review at your convenience later.  YOU SHOULD EXPECT: Some feelings of bloating in the abdomen. Passage of more gas than usual.  Walking can help get rid of the air that was put into your GI tract during the procedure and reduce the bloating. If you had a lower endoscopy (such as a colonoscopy or flexible sigmoidoscopy) you may notice spotting of blood in your stool or on the toilet paper. If you underwent a bowel prep for your procedure, you may not have a normal bowel movement for a few days.  Please Note:  You might notice some irritation and congestion in your nose or some drainage.  This is from the oxygen used during your procedure.  There is no need for concern and it should clear up in a day or so.  SYMPTOMS TO REPORT IMMEDIATELY:   Following lower endoscopy (colonoscopy or flexible sigmoidoscopy):  Excessive amounts of blood in the stool  Significant tenderness or worsening of abdominal pains  Swelling of the abdomen that is new, acute  Fever of 100F or higher   For urgent or emergent issues, a gastroenterologist can be reached at any hour by calling [phone].   DIET:  We do recommend a small meal at first, but then you may proceed to your regular diet.  Drink plenty of fluids but you should avoid alcoholic beverages for 24 hours.  ACTIVITY:  You should plan to take it  easy for the rest of today and you should NOT DRIVE or use heavy machinery until tomorrow (because of the sedation medicines used during the test).    FOLLOW UP: Our staff will call the number listed on your records 48-72 hours following your procedure to check on you and address any questions or concerns that you may have regarding the information given to you following your procedure. If we do not reach you, we will leave a message.  We will attempt to reach you two times.  During this call, we will ask if you have developed any symptoms of COVID 19. If you develop any symptoms (for example fever, flu-like symptoms, shortness of breath, cough etc.) before then, please call [phone].  If any biopsies were taken you will be contacted by phone or by letter within the next 1-3 weeks.  Please call us at [phone] if you have not heard about the biopsies in 3 weeks.    SIGNATURES/CONFIDENTIALITY: You and/or your care partner have signed paperwork which will be entered into your electronic medical record.  These signatures attest to the fact that that the information above on your After Visit Summary has been reviewed and is understood.  Full responsibility of the confidentiality of this discharge information lies with you and/or your care-partner.

## 2018-06-16 ENCOUNTER — Telehealth: Payer: Self-pay | Admitting: *Deleted

## 2018-06-16 ENCOUNTER — Encounter: Payer: BC Managed Care – PPO | Admitting: Gastroenterology

## 2018-06-16 NOTE — Telephone Encounter (Signed)
  Follow up Call-  Call back number 06/12/2018  Post procedure Call Back phone  # [phone]  Permission to leave phone message Yes  Some recent data might be hidden     Patient questions:  Do you have a fever, pain , or abdominal swelling? No. Pain Score  0 *  Have you tolerated food without any problems? Yes.    Have you been able to return to your normal activities? Yes.    Do you have any questions about your discharge instructions: Diet   No. Medications  No. Follow up visit  No.  Do you have questions or concerns about your Care? Yes.    Actions: * If pain score is 4 or above: No action needed, pain <4.  1. Have you developed a fever since your procedure? no  2.   Have you had an respiratory symptoms (SOB or cough) since your procedure? no  3.   Have you tested positive for COVID 19 since your procedure no  4.   Have you had any family members/close contacts diagnosed with the COVID 19 since your procedure?  no   If any of these questions are a yes, please inquire if patient has been seen by family doctor and route this note to Joylene John, Therapist, sports.

## 2018-06-18 ENCOUNTER — Encounter: Payer: Self-pay | Admitting: Gastroenterology

## 2018-06-30 ENCOUNTER — Telehealth: Payer: Self-pay | Admitting: Gastroenterology

## 2018-06-30 NOTE — Telephone Encounter (Signed)
Spoke to the patient who reports that her hemorrhoids are "flaring up." The patient reports she is keeping the anal area clean and dry and is avoiding vigorous rubbing. The patient denies constipation or straining during defecation and reports her stools are bulky, formed and soft. The patient is currently using prep H with no relief. Sitz baths are helping but only for an hour or so then the itching, pain and pressure returns. The patient states "It just feels like something is down there." The patient reports a few years ago she was prescribed "suppositories" (unknown what kind) for the hemorrhoids but it only helped "a little." The patient is requesting a prescription for anything that could bring relief from the pain, itching and pressure. Please advise.

## 2018-06-30 NOTE — Telephone Encounter (Signed)
I'd actually advise setting up an in-office appt with me for planned hemorrhoid banding, as this should hopefully provide more durable, long-term relief of sxs.

## 2018-06-30 NOTE — Telephone Encounter (Signed)
Spoke to patient who has reservations about driving to [place], as she lives in [place]. Scheduled hemorrhoid banding on 07/02/18 at 8:30 am with Dr. Bryan Lemma at [facility] office. Patient reported to this RN that she may cancel due to the drive and possibly change her care to the [facility] office. Nothing further at the time of the call.

## 2018-07-02 ENCOUNTER — Encounter: Payer: BC Managed Care – PPO | Admitting: Gastroenterology

## 2018-07-02 ENCOUNTER — Ambulatory Visit (INDEPENDENT_AMBULATORY_CARE_PROVIDER_SITE_OTHER): Payer: BC Managed Care – PPO | Admitting: Gastroenterology

## 2018-07-02 ENCOUNTER — Other Ambulatory Visit: Payer: Self-pay

## 2018-07-02 ENCOUNTER — Encounter: Payer: Self-pay | Admitting: Gastroenterology

## 2018-07-02 VITALS — BP 128/88 | HR 72 | Ht 62.5 in | Wt 162.1 lb

## 2018-07-02 DIAGNOSIS — K921 Melena: Secondary | ICD-10-CM

## 2018-07-02 DIAGNOSIS — L290 Pruritus ani: Secondary | ICD-10-CM

## 2018-07-02 DIAGNOSIS — K649 Unspecified hemorrhoids: Secondary | ICD-10-CM

## 2018-07-02 NOTE — Progress Notes (Signed)
P  Chief Complaint:    Symptomatic Internal Hemorrhoids; Hemorrhoid Band Ligation  GI History: 53 year old female with symptomatic internal hemorrhoids, with index symptoms of intermittent scant BRBPR, rectal itching, and irritation.  No relief with Preparation H.  Some improvement with Sitz bath.  Previously used suppositories with only minimal improvement.  Otherwise, no constipation or diarrhea.  Endoscopic history: - Colonoscopy (05/2018, Dr. Bryan Lemma, routine CRC screening): 3 tubular adenomas, one sessile serrated polyp, all <10 mm; normal TI, internal hemorrhoids   HPI:    Patient is a 53 y.o. female with a history of symptomatic internal hemorrhoids presenting to the Gastroenterology Clinic for follow-up and ongoing treatment. The patient presents with symptomatic grade 2 internal hemorrhoids, unresponsive to maximal medical therapy, requesting rubber band ligation of symptomatic hemorrhoidal disease.  No change in medical or surgical history, medications, allergies, social history since last appointment with me.   Review of systems:     No chest pain, no SOB, no fevers, no urinary sx   Past Medical History:  Diagnosis Date  . Allergy    Claritin daily  . Anxiety     Patient's surgical history, family medical history, social history, medications and allergies were all reviewed in Epic    Current Outpatient Medications  Medication Sig Dispense Refill  . b complex vitamins tablet Take 1 tablet by mouth daily.    . Calcium-Magnesium-Vitamin D (CALCIUM MAGNESIUM PO) Take 1,000 mg by mouth daily.    . Clindamycin Phosphate foam Apply 1 application topically 2 (two) times daily. 100 g 11  . loratadine (CLARITIN) 10 MG tablet Take 10 mg by mouth daily.    . Multiple Vitamin (MULTIVITAMIN) tablet Take 1 tablet by mouth daily.    . norethindrone-ethinyl estradiol (JUNEL FE,GILDESS FE,LOESTRIN FE) 1-20 MG-MCG tablet Take 1 tablet by mouth daily.    Marland Kitchen PARoxetine (PAXIL) 10 MG  tablet Take 1 tablet (10 mg total) by mouth every morning. 90 tablet 3  . VITAMIN D, CHOLECALCIFEROL, PO Take 1,000 Units by mouth daily.     No current facility-administered medications for this visit.     Physical Exam:     LMP 06/05/2018 (Approximate)   GENERAL:  Pleasant female in NAD PSYCH: : Cooperative, normal affect EENT:  conjunctiva pink, mucous membranes moist, neck supple without masses CARDIAC:  RRR, no murmur heard, no peripheral edema PULM: Normal respiratory effort, lungs CTA bilaterally, no wheezing ABDOMEN:  Nondistended, soft, nontender. No obvious masses, no hepatomegaly,  normal bowel sounds SKIN:  turgor, no lesions seen Musculoskeletal:  Normal muscle tone, normal strength NEURO: Alert and oriented x 3, no focal neurologic deficits Rectal exam: Sensation intact and preserved anal wink.  Grade 2 hemorrhoids noted in LL and RA positions on anoscopy. Small internal fissure noted in 6 oclock position. No external anal fissures noted. Normal sphincter tone. No palpable mass. No blood on the exam glove. (Chaperone: Herma Mering).   IMPRESSION and PLAN:    #1.  Symptomatic internal hemorrhoids: PROCEDURE NOTE: The patient presents with symptomatic grade 2  hemorrhoids, unresponsive to maximal medical therapy, requesting rubber band ligation of symptomatic hemorrhoidal disease.  All risks, benefits and alternative forms of therapy were described and informed consent was obtained.  In the Left Lateral Decubitus position, anoscopic examination revealed grade 2 hemorrhoids in the LL and RA position(s).  The anorectum was pre-medicated with topical nitroglycerin. The decision was made to band the LL internal hemorrhoid, and the [place] was used to perform band  ligation without complication.  Digital anorectal examination was then performed to assure proper positioning of the band, and to adjust the banded tissue as required.  The patient was discharged home  without pain or other issues.  Dietary and behavioral recommendations were given and along with follow-up instructions.     The following adjunctive treatments were recommended:  -Resume high-fiber diet with fiber supplement (i.e. Citrucel or Benefiber) with goal for soft stools without straining to have a BM. -Resume adequate fluid intake. -Sitz bath's as needed  The patient will return in 2-4 for  follow-up and banding of the RA hemorrhoid. No complications were encountered and the patient tolerated the procedure well.          [name] ,DO, FACG 07/02/2018, 9:27 AM

## 2018-07-02 NOTE — Patient Instructions (Signed)
If you are age 51 or older, your body mass index should be between 23-30. Your Body mass index is 29.18 kg/m. If this is out of the aforementioned range listed, please consider follow up with your Primary Care Provider.  If you are age 46 or younger, your body mass index should be between 19-25. Your Body mass index is 29.18 kg/m. If this is out of the aformentioned range listed, please consider follow up with your Primary Care Provider.   To help prevent the possible spread of infection to our patients, communities, and staff; we will be implementing the following measures:  As of now we are not allowing any visitors/family members to accompany you to any upcoming appointments with [facility]. If you have any concerns about this please contact our office to discuss prior to the appointment.   HEMORRHOID BANDING PROCEDURE    FOLLOW-UP CARE   1. The procedure you have had should have been relatively painless since the banding of the area involved does not have nerve endings and there is no pain sensation.  The rubber band cuts off the blood supply to the hemorrhoid and the band may fall off as soon as 48 hours after the banding (the band may occasionally be seen in the toilet bowl following a bowel movement). You may notice a temporary feeling of fullness in the rectum which should respond adequately to plain Tylenol or Motrin.  2. Following the banding, avoid strenuous exercise that evening and resume full activity the next day.  A sitz bath (soaking in a warm tub) or bidet is soothing, and can be useful for cleansing the area after bowel movements.     3. To avoid constipation, take two tablespoons of natural wheat bran, natural oat bran, flax, Benefiber or any over the counter fiber supplement and increase your water intake to 7-8 glasses daily.    4. Unless you have been prescribed anorectal medication, do not put anything inside your rectum for two weeks: No suppositories,  enemas, fingers, etc.  5. Occasionally, you may have more bleeding than usual after the banding procedure.  This is often from the untreated hemorrhoids rather than the treated one.  Don't be concerned if there is a tablespoon or so of blood.  If there is more blood than this, lie flat with your bottom higher than your head and apply an ice pack to the area. If the bleeding does not stop within a half an hour or if you feel faint, call our office at [phone] or go to the emergency room.  6. Problems are not common; however, if there is a substantial amount of bleeding, severe pain, chills, fever or difficulty passing urine (very rare) or other problems, you should call us at [phone] or report to the nearest emergency room.  7. Do not stay seated continuously for more than 2-3 hours for a day or two after the procedure.  Tighten your buttock muscles 10-15 times every two hours and take 10-15 deep breaths every 1-2 hours.  Do not spend more than a few minutes on the toilet if you cannot empty your bowel; instead re-visit the toilet at a later time.    It was a pleasure to see you today!  Vito Cirigliano, D.O.

## 2018-07-29 ENCOUNTER — Telehealth: Payer: Self-pay | Admitting: Gastroenterology

## 2018-07-29 NOTE — Telephone Encounter (Signed)
Called and spoke with patient-patient has been rescheduled for hemorrhoid banding on 08/13/2018 at 10:00 per patient request to change appt date/time;

## 2018-07-29 NOTE — Telephone Encounter (Signed)
Patient called in wanting to reschedule appt for 07/30/2018 for HEMORRHOID BANDING... however I was unable to reschedule provider had no dates available for rest of July and august. I have cancelled appt please advised on the rescheduling thanks.

## 2018-07-30 ENCOUNTER — Encounter: Payer: BC Managed Care – PPO | Admitting: Gastroenterology

## 2018-08-13 ENCOUNTER — Ambulatory Visit (INDEPENDENT_AMBULATORY_CARE_PROVIDER_SITE_OTHER): Payer: BC Managed Care – PPO | Admitting: Gastroenterology

## 2018-08-13 ENCOUNTER — Encounter: Payer: Self-pay | Admitting: Gastroenterology

## 2018-08-13 ENCOUNTER — Other Ambulatory Visit: Payer: Self-pay

## 2018-08-13 DIAGNOSIS — K649 Unspecified hemorrhoids: Secondary | ICD-10-CM | POA: Diagnosis not present

## 2018-08-13 DIAGNOSIS — Z86010 Personal history of colonic polyps: Secondary | ICD-10-CM | POA: Diagnosis not present

## 2018-08-13 NOTE — Patient Instructions (Signed)
If you are age 51 or older, your body mass index should be between 23-30. Your There is no height or weight on file to calculate BMI. If this is out of the aforementioned range listed, please consider follow up with your Primary Care Provider.  If you are age 37 or younger, your body mass index should be between 19-25. Your There is no height or weight on file to calculate BMI. If this is out of the aformentioned range listed, please consider follow up with your Primary Care Provider.   To help prevent the possible spread of infection to our patients, communities, and staff; we will be implementing the following measures:  As of now we are not allowing any visitors/family members to accompany you to any upcoming appointments with [facility]. If you have any concerns about this please contact our office to discuss prior to the appointment.   HEMORRHOID BANDING PROCEDURE    FOLLOW-UP CARE   1. The procedure you have had should have been relatively painless since the banding of the area involved does not have nerve endings and there is no pain sensation.  The rubber band cuts off the blood supply to the hemorrhoid and the band may fall off as soon as 48 hours after the banding (the band may occasionally be seen in the toilet bowl following a bowel movement). You may notice a temporary feeling of fullness in the rectum which should respond adequately to plain Tylenol or Motrin.  2. Following the banding, avoid strenuous exercise that evening and resume full activity the next day.  A sitz bath (soaking in a warm tub) or bidet is soothing, and can be useful for cleansing the area after bowel movements.     3. To avoid constipation, take two tablespoons of natural wheat bran, natural oat bran, flax, Benefiber or any over the counter fiber supplement and increase your water intake to 7-8 glasses daily.    4. Unless you have been prescribed anorectal medication, do not put anything inside  your rectum for two weeks: No suppositories, enemas, fingers, etc.  5. Occasionally, you may have more bleeding than usual after the banding procedure.  This is often from the untreated hemorrhoids rather than the treated one.  Don't be concerned if there is a tablespoon or so of blood.  If there is more blood than this, lie flat with your bottom higher than your head and apply an ice pack to the area. If the bleeding does not stop within a half an hour or if you feel faint, call our office at [phone] or go to the emergency room.  6. Problems are not common; however, if there is a substantial amount of bleeding, severe pain, chills, fever or difficulty passing urine (very rare) or other problems, you should call us at [phone] or report to the nearest emergency room.  7. Do not stay seated continuously for more than 2-3 hours for a day or two after the procedure.  Tighten your buttock muscles 10-15 times every two hours and take 10-15 deep breaths every 1-2 hours.  Do not spend more than a few minutes on the toilet if you cannot empty your bowel; instead re-visit the toilet at a later time.    It was a pleasure to see you today!  Vito Cirigliano, D.O.

## 2018-08-13 NOTE — Progress Notes (Signed)
P  Chief Complaint:    Symptomatic Internal Hemorrhoids; Hemorrhoid Band Ligation  GI History: 53 year old female with symptomatic internal hemorrhoids, with index symptoms of intermittent scant BRBPR, rectal itching, and irritation.  No relief with Preparation H.  Some improvement with Sitz bath.  Previously used suppositories with only minimal improvement.  Otherwise, no constipation or diarrhea.    - 07/02/2018: Banding of the LL hemorrhoid  Endoscopic history: - Colonoscopy (05/2018, Dr. Bryan Lemma, routine CRC screening): 3 tubular adenomas, one sessile serrated polyp, all <10 mm; normal TI, internal hemorrhoids  HPI:     Patient is a 53 y.o. femalewith a history of symptomatic internal hemorrhoids presenting to the [facility] for follow-up and ongoing treatment. The patient presents with symptomatic grade 2 hemorrhoids, unresponsive to maximal medical therapy, requesting rubber band ligation of symptomatic hemorrhoidal disease.  Previously banded the LL hemorrhoid on 07/02/2018 and presents today for banding of the RA hemorrhoid.  No change in medical or surgical history, medications, allergies, social history since last appointment with me.   Review of systems:     No chest pain, no SOB, no fevers, no urinary sx   Past Medical History:  Diagnosis Date  . Allergy    Claritin daily  . Anxiety     Patient's surgical history, family medical history, social history, medications and allergies were all reviewed in Epic    Current Outpatient Medications  Medication Sig Dispense Refill  . b complex vitamins tablet Take 1 tablet by mouth daily.    . Calcium-Magnesium-Vitamin D (CALCIUM MAGNESIUM PO) Take 1,000 mg by mouth daily.    . Clindamycin Phosphate foam Apply 1 application topically 2 (two) times daily. 100 g 11  . loratadine (CLARITIN) 10 MG tablet Take 10 mg by mouth daily.    . Multiple Vitamin (MULTIVITAMIN) tablet Take 1 tablet by mouth daily.    .  norethindrone-ethinyl estradiol (JUNEL FE,GILDESS FE,LOESTRIN FE) 1-20 MG-MCG tablet Take 1 tablet by mouth daily.    Marland Kitchen PARoxetine (PAXIL) 10 MG tablet Take 1 tablet (10 mg total) by mouth every morning. 90 tablet 3  . VITAMIN D, CHOLECALCIFEROL, PO Take 1,000 Units by mouth daily.     No current facility-administered medications for this visit.     Physical Exam:     There were no vitals taken for this visit.  GENERAL:  Pleasant female in NAD PSYCH: : Cooperative, normal affect EENT:  conjunctiva pink, mucous membranes moist, neck supple without masses ABDOMEN:  Nondistended, soft, nontender. No obvious masses, no hepatomegaly,  normal bowel sounds SKIN:  turgor, no lesions seen Musculoskeletal:  Normal muscle tone, normal strength NEURO: Alert and oriented x 3, no focal neurologic deficits Rectal exam: Sensation intact and preserved anal wink.  Grade 2 hemorrhoids noted in RA and RP positions and appropriate scar from prior banding in the LL position on anoscopy.  No external anal fissures noted. Normal sphincter tone. No palpable mass. No blood on the exam glove. (Chaperone: Herma Mering).   IMPRESSION and PLAN:    #1.  Symptomatic internal hemorrhoids: PROCEDURE NOTE: The patient presents with symptomatic grade 2 hemorrhoids, unresponsive to maximal medical therapy, requesting rubber band ligation of symptomatic hemorrhoidal disease.  All risks, benefits and alternative forms of therapy were described and informed consent was obtained.  In the Left Lateral Decubitus position, anoscopic examination revealed grade 2 hemorrhoids in the RA and RP position(s).  The anorectum was pre-medicated with RectiCare. The decision was made to band the RA  internal hemorrhoid, and the [place] was used to perform band ligation without complication.  Digital anorectal examination was then performed to assure proper positioning of the band, and to adjust the banded tissue as required.  The  patient was discharged home without pain or other issues.  Dietary and behavioral recommendations were given and along with follow-up instructions.     The following adjunctive treatments were recommended:  -Resume high-fiber diet with fiber supplement (i.e. Citrucel or Benefiber) with goal for soft stools without straining to have a BM. -Resume adequate fluid intake.  The patient will return in 2-4 for  follow-up and possible additional banding of the RP hemorrhoid as required. No complications were encountered and the patient tolerated the procedure well.      #2.  History of Tubular Adenomas and sessile serrated polyp: - Repeat colonoscopy in 2023 for ongoing polyp surveillance.      [place] ,DO, FACG 08/13/2018, 10:06 AM

## 2018-09-09 ENCOUNTER — Encounter: Payer: BC Managed Care – PPO | Admitting: Gastroenterology

## 2018-09-23 ENCOUNTER — Encounter: Payer: BC Managed Care – PPO | Admitting: Gastroenterology

## 2018-09-24 ENCOUNTER — Other Ambulatory Visit: Payer: Self-pay | Admitting: Family Medicine

## 2018-09-24 NOTE — Telephone Encounter (Signed)
Forwarding medication refill request to the clinical pool for review. 

## 2018-09-25 ENCOUNTER — Telehealth: Payer: Self-pay | Admitting: Family Medicine

## 2018-09-25 NOTE — Telephone Encounter (Signed)
Pt made an appt on 9/30 for med refills. Requesting refill to last until appt time. Please advise PARoxetine

## 2018-10-01 ENCOUNTER — Other Ambulatory Visit: Payer: Self-pay | Admitting: *Deleted

## 2018-10-01 ENCOUNTER — Other Ambulatory Visit: Payer: Self-pay | Admitting: Family Medicine

## 2018-10-01 MED ORDER — PAROXETINE HCL 10 MG PO TABS: 10.0000 mg | ORAL_TABLET | ORAL | 0 refills | Status: DC

## 2018-10-01 NOTE — Telephone Encounter (Signed)
Patient calling again to check status of this request for partial fill until appointment. Patient has one tablet left. Please advise.

## 2018-10-01 NOTE — Telephone Encounter (Signed)
Prescription sent

## 2018-10-01 NOTE — Telephone Encounter (Signed)
Requested medication (s) are due for refill today: yes   Requested medication (s) are on the active medication list: yes  Last refill:  06/25/2018  Future visit scheduled: yes  Notes to clinic: review for refill   Requested Prescriptions  Pending Prescriptions Disp Refills   PARoxetine (PAXIL) 10 MG tablet [Pharmacy Med Name: PAROXETINE HCL 10 MG TABLET] 90 tablet 3    Sig: TAKE 1 TABLET BY MOUTH EVERY [place]     Psychiatry:  Antidepressants - SSRI Failed - 10/01/2018  9:50 AM      Failed - Valid encounter within last 6 months    Recent Outpatient Visits          10 months ago Cough   [facility] at [facility], Ines Bloomer, MD   1 year ago Routine physical examination   Primary Care at [facility], [place], MD   2 years ago Routine physical examination   Primary Care at [facility], Renette Butters, MD   2 years ago Inflamed external hemorrhoid   Primary Care at [facility], Renette Butters, MD   2 years ago Benign paroxysmal positional vertigo of right ear   Primary Care at [facility], Renette Butters, MD      Future Appointments            In 2 weeks Forrest Moron, MD Primary [facility] at [place]           Failed - Completed PHQ-2 or PHQ-9 in the last 360 days.

## 2018-10-06 ENCOUNTER — Encounter: Payer: Self-pay | Admitting: Gastroenterology

## 2018-10-06 ENCOUNTER — Ambulatory Visit: Payer: BC Managed Care – PPO | Admitting: Gastroenterology

## 2018-10-06 ENCOUNTER — Other Ambulatory Visit: Payer: Self-pay

## 2018-10-06 VITALS — BP 118/82 | HR 78 | Temp 98.5°F | Ht 62.5 in | Wt 162.1 lb

## 2018-10-06 DIAGNOSIS — Z860100 Personal history of colon polyps, unspecified: Secondary | ICD-10-CM

## 2018-10-06 DIAGNOSIS — K649 Unspecified hemorrhoids: Secondary | ICD-10-CM

## 2018-10-06 DIAGNOSIS — K921 Melena: Secondary | ICD-10-CM

## 2018-10-06 NOTE — Progress Notes (Signed)
P  Chief Complaint:    Symptomatic Internal Hemorrhoids; Hemorrhoid Band Ligation  GI History: 53 year old female with symptomatic internal hemorrhoids, with index symptoms of intermittentscantBRBPR, rectal itching, and irritation. No relief with Preparation H. Some improvement with Sitz bath. Previously used suppositories with only minimal improvement. Otherwise, no constipation or diarrhea.    - 07/02/2018: Banding of the LL hemorrhoid - 08/13/2018: Banding of the RA hemorrhoid  Has had significant response to hemorrhoid banding x2 to date.  Endoscopic history: -Colonoscopy (05/2018, Dr. Bryan Lemma, routine CRC screening): 3 tubular adenomas, one sessile serrated polyp, all <66mm;normal TI, internal hemorrhoids  HPI:     Patient is a 53 y.o. femalewith a history of symptomatic internal hemorrhoids presenting to the [facility]ology Clinic for follow-up and ongoing treatment. The patient presents with symptomatic grade 2 hemorrhoids, unresponsive to maximal medical therapy, requesting ongoing rubber band ligation of symptomatic hemorrhoidal disease.  No change in medical or surgical history, medications, allergies, social history since last appointment with me.   Review of systems:     No chest pain, no SOB, no fevers, no urinary sx   Past Medical History:  Diagnosis Date  . Allergy    Claritin daily  . Anxiety     Patient's surgical history, family medical history, social history, medications and allergies were all reviewed in Epic    Current Outpatient Medications  Medication Sig Dispense Refill  . b complex vitamins tablet Take 1 tablet by mouth daily.    . Calcium-Magnesium-Vitamin D (CALCIUM MAGNESIUM PO) Take 1,000 mg by mouth daily.    . Clindamycin Phosphate foam Apply 1 application topically 2 (two) times daily. 100 g 11  . loratadine (CLARITIN) 10 MG tablet Take 10 mg by mouth daily.    . Multiple Vitamin (MULTIVITAMIN) tablet Take 1 tablet by mouth  daily.    . norethindrone-ethinyl estradiol (JUNEL FE,GILDESS FE,LOESTRIN FE) 1-20 MG-MCG tablet Take 1 tablet by mouth daily.    Marland Kitchen PARoxetine (PAXIL) 10 MG tablet Take 1 tablet (10 mg total) by mouth every morning. 30 tablet 0  . VITAMIN D, CHOLECALCIFEROL, PO Take 1,000 Units by mouth daily.     No current facility-administered medications for this visit.     Physical Exam:     BP 118/82   Pulse 78   Temp 98.5 F (36.9 C)   Ht 5' 2.5" (1.588 m)   Wt 162 lb 2 oz (73.5 kg)   BMI 29.18 kg/m   GENERAL:  Pleasant female in NAD PSYCH: : Cooperative, normal affect EENT:  conjunctiva pink, mucous membranes moist, neck supple without masses SKIN:  turgor, no lesions seen Musculoskeletal:  Normal muscle tone, normal strength NEURO: Alert and oriented x 3, no focal neurologic deficits Rectal exam: Sensation intact and preserved anal wink.   No external anal fissures noted. Normal sphincter tone. No palpable mass. No blood on the exam glove. (Chaperone: Curlene Labrum, CMA).   IMPRESSION and PLAN:    #1.  Symptomatic internal hemorrhoids: PROCEDURE NOTE: The patient presents with symptomatic grade 2 hemorrhoids, unresponsive to maximal medical therapy, requesting rubber band ligation of symptomatic hemorrhoidal disease.  All risks, benefits and alternative forms of therapy were described and informed consent was obtained.  In the Left Lateral Decubitus position, anoscopic examination revealed grade 2 hemorrhoid in the RP position.  The anorectum was pre-medicated with RectiCare. The decision was made to band the RP internal hemorrhoid, and the [place] was used to perform band ligation without complication.  Digital anorectal examination was then performed to assure proper positioning of the band, and to adjust the banded tissue as required.  The patient was discharged home without pain or other issues.  Dietary and behavioral recommendations were given and along with follow-up  instructions.     The following adjunctive treatments were recommended:  -Resume high-fiber diet with fiber supplement (i.e. Citrucel or Benefiber) with goal for soft stools without straining to have a BM. -Resume adequate fluid intake.  The patient will return prn  follow-up and possible additional banding as required. No complications were encountered and the patient tolerated the procedure well.   #2.  History of Tubular Adenomas and sessile serrated polyp: - Repeat colonoscopy in 2023 for ongoing polyp surveillance.      [name] ,DO, FACG 10/06/2018, 8:33 AM

## 2018-10-06 NOTE — Patient Instructions (Signed)
If you are age 51 or older, your body mass index should be between 23-30. Your Body mass index is 29.18 kg/m. If this is out of the aforementioned range listed, please consider follow up with your Primary Care Provider.  If you are age 64 or younger, your body mass index should be between 19-25. Your Body mass index is 29.18 kg/m. If this is out of the aformentioned range listed, please consider follow up with your Primary Care Provider.   To help prevent the possible spread of infection to our patients, communities, and staff; we will be implementing the following measures:  As of now we are not allowing any visitors/family members to accompany you to any upcoming appointments with [facility]. If you have any concerns about this please contact our office to discuss prior to the appointment.   HEMORRHOID BANDING PROCEDURE    FOLLOW-UP CARE   1. The procedure you have had should have been relatively painless since the banding of the area involved does not have nerve endings and there is no pain sensation.  The rubber band cuts off the blood supply to the hemorrhoid and the band may fall off as soon as 48 hours after the banding (the band may occasionally be seen in the toilet bowl following a bowel movement). You may notice a temporary feeling of fullness in the rectum which should respond adequately to plain Tylenol or Motrin.  2. Following the banding, avoid strenuous exercise that evening and resume full activity the next day.  A sitz bath (soaking in a warm tub) or bidet is soothing, and can be useful for cleansing the area after bowel movements.     3. To avoid constipation, take two tablespoons of natural wheat bran, natural oat bran, flax, Benefiber or any over the counter fiber supplement and increase your water intake to 7-8 glasses daily.    4. Unless you have been prescribed anorectal medication, do not put anything inside your rectum for two weeks: No suppositories,  enemas, fingers, etc.  5. Occasionally, you may have more bleeding than usual after the banding procedure.  This is often from the untreated hemorrhoids rather than the treated one.  Don't be concerned if there is a tablespoon or so of blood.  If there is more blood than this, lie flat with your bottom higher than your head and apply an ice pack to the area. If the bleeding does not stop within a half an hour or if you feel faint, call our office at [phone] or go to the emergency room.  6. Problems are not common; however, if there is a substantial amount of bleeding, severe pain, chills, fever or difficulty passing urine (very rare) or other problems, you should call us at [phone] or report to the nearest emergency room.  7. Do not stay seated continuously for more than 2-3 hours for a day or two after the procedure.  Tighten your buttock muscles 10-15 times every two hours and take 10-15 deep breaths every 1-2 hours.  Do not spend more than a few minutes on the toilet if you cannot empty your bowel; instead re-visit the toilet at a later time.    It was a pleasure to see you today!  Vito Cirigliano, D.O.  

## 2018-10-21 ENCOUNTER — Encounter: Payer: Self-pay | Admitting: Family Medicine

## 2018-10-21 ENCOUNTER — Ambulatory Visit: Payer: BC Managed Care – PPO | Admitting: Family Medicine

## 2018-10-21 ENCOUNTER — Other Ambulatory Visit: Payer: Self-pay

## 2018-10-21 VITALS — BP 138/86 | HR 69 | Temp 98.6°F | Ht 63.0 in | Wt 165.0 lb

## 2018-10-21 DIAGNOSIS — Z760 Encounter for issue of repeat prescription: Secondary | ICD-10-CM | POA: Diagnosis not present

## 2018-10-21 DIAGNOSIS — Z23 Encounter for immunization: Secondary | ICD-10-CM | POA: Diagnosis not present

## 2018-10-21 DIAGNOSIS — F411 Generalized anxiety disorder: Secondary | ICD-10-CM

## 2018-10-21 MED ORDER — PAROXETINE HCL 10 MG PO TABS: 10.0000 mg | ORAL_TABLET | ORAL | 3 refills | Status: DC

## 2018-10-21 NOTE — Progress Notes (Signed)
Established Patient Office Visit  Subjective:  Patient ID: Lori Wheeler, female    DOB: 1965-07-24  Age: 53 y.o. MRN: [id number]  CC:  Chief Complaint  Patient presents with  . Medication Refill    paxil     HPI [name] presents for    Patient has been on Paxil 10mg  daily  She reports that it helps to control her anxiety She denies any side effects She has been compliant with the paxil for the past 15 years She denies any chest pains, palpitations, or any blurry vision.    Past Medical History:  Diagnosis Date  . Allergy    Claritin daily  . Anxiety     Past Surgical History:  Procedure Laterality Date  . Admission     Behavioral Health admission; 2 weeks.  Marland Kitchen MANDIBLE SURGERY  1991    Family History  Problem Relation Age of Onset  . Diabetes Brother   . Hypertension Brother   . Hyperlipidemia Brother   . Cancer Brother        kidney cancer  . Hyperlipidemia Brother   . Hypertension Brother   . Hyperlipidemia Brother   . Hypertension Brother   . Cancer Father        brain tumor  . Colon cancer Neg Hx   . Esophageal cancer Neg Hx   . Rectal cancer Neg Hx   . Stomach cancer Neg Hx   . Colon polyps Neg Hx     Social History   Socioeconomic History  . Marital status: Married    Spouse name: Not on file  . Number of children: 0  . Years of education: Not on file  . Highest education level: Not on file  Occupational History  . Occupation: Dance movement psychotherapist  Social Needs  . Financial resource strain: Not on file  . Food insecurity    Worry: Not on file    Inability: Not on file  . Transportation needs    Medical: Not on file    Non-medical: Not on file  Tobacco Use  . Smoking status: Former Smoker    Years: 1.00    Types: Cigarettes    Quit date: 01/22/1983    Years since quitting: 35.7  . Smokeless tobacco: Never Used  . Tobacco comment: high school only   Substance and Sexual Activity  . Alcohol use: Yes    Comment:  beer - 6 pack/week  . Drug use: Never  . Sexual activity: Yes    Birth control/protection: Pill  Lifestyle  . Physical activity    Days per week: Not on file    Minutes per session: Not on file  . Stress: Not on file  Relationships  . Social Herbalist on phone: Not on file    Gets together: Not on file    Attends religious service: Not on file    Active member of club or organization: Not on file    Attends meetings of clubs or organizations: Not on file    Relationship status: Not on file  . Intimate partner violence    Fear of current or ex partner: Not on file    Emotionally abused: Not on file    Physically abused: Not on file    Forced sexual activity: Not on file  Other Topics Concern  . Not on file  Social History Narrative   Marital status:  Married x 28 years      Children: none;  one dog      Lives: with husband, dog.       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      Diet:  VEGETARIAN; Tofu and beans; smoothies with protein powder; B complex    Outpatient Medications Prior to Visit  Medication Sig Dispense Refill  . b complex vitamins tablet Take 1 tablet by mouth daily.    . Calcium-Magnesium-Vitamin D (CALCIUM MAGNESIUM PO) Take 1,000 mg by mouth daily.    . Clindamycin Phosphate foam Apply 1 application topically 2 (two) times daily. 100 g 11  . loratadine (CLARITIN) 10 MG tablet Take 10 mg by mouth daily.    . Multiple Vitamin (MULTIVITAMIN) tablet Take 1 tablet by mouth daily.    . norethindrone-ethinyl estradiol (JUNEL FE,GILDESS FE,LOESTRIN FE) 1-20 MG-MCG tablet Take 1 tablet by mouth daily.    Marland Kitchen VITAMIN D, CHOLECALCIFEROL, PO Take 1,000 Units by mouth daily.    Marland Kitchen PARoxetine (PAXIL) 10 MG tablet Take 1 tablet (10 mg total) by mouth every morning. 30 tablet 0   No facility-administered medications prior  to visit.     No Known Allergies  ROS Review of Systems    Objective:    Physical Exam  BP 138/86 (BP Location: Right Arm, Patient Position: Sitting, Cuff Size: Normal)   Pulse 69   Temp 98.6 F (37 C) (Oral)   Ht 5\' 3"  (1.6 m)   Wt 165 lb (74.8 kg)   LMP 10/18/2018   SpO2 98%   BMI 29.23 kg/m  Wt Readings from Last 3 Encounters:  10/21/18 165 lb (74.8 kg)  10/06/18 162 lb 2 oz (73.5 kg)  07/02/18 162 lb 2 oz (73.5 kg)   Physical Exam  Constitutional: Oriented to person, place, and time. Appears well-developed and well-nourished.  HENT:  Head: Normocephalic and atraumatic.  Eyes: Conjunctivae and EOM are normal.  Cardiovascular: Normal rate, regular rhythm, normal heart sounds and intact distal pulses.  No murmur heard. Pulmonary/Chest: Effort normal and breath sounds normal. No stridor. No respiratory distress. Has no wheezes.  Neurological: Is alert and oriented to person, place, and time.  Skin: Skin is warm. Capillary refill takes less than 2 seconds.  Psychiatric: Has a normal mood and affect. Behavior is normal. Judgment and thought content normal.    Health Maintenance Due  Topic Date Due  . PAP SMEAR-Modifier  08/21/2017  . MAMMOGRAM  10/11/2018    There are no preventive care reminders to display for this patient.  Lab Results  Component Value Date   TSH 2.520 09/06/2016   Lab Results  Component Value Date   WBC 6.6 09/06/2016   HGB 12.8 09/06/2016   HCT 39.2 09/06/2016   MCV 93 09/06/2016   PLT 293 09/06/2016   Lab Results  Component Value Date   NA 138 09/18/2017   K 4.7 09/18/2017   CO2 19 (L) 09/18/2017   GLUCOSE 90 09/18/2017   BUN 7 09/18/2017   CREATININE 0.75 09/18/2017   BILITOT 0.4 09/18/2017   ALKPHOS 40 09/18/2017   AST 19 09/18/2017   ALT 17 09/18/2017   PROT 7.0 09/18/2017   ALBUMIN 4.4 09/18/2017   CALCIUM 9.5 09/18/2017   Lab Results  Component Value Date   CHOL  194 09/18/2017   Lab Results  Component Value  Date   HDL 69 09/18/2017   Lab Results  Component Value Date   LDLCALC 100 (H) 09/18/2017   Lab Results  Component Value Date   TRIG 125 09/18/2017   Lab Results  Component Value Date   CHOLHDL 2.8 09/18/2017   Lab Results  Component Value Date   HGBA1C 5.4 09/06/2016      Assessment & Plan:   Problem List Items Addressed This Visit      Other   Generalized anxiety disorder  -  Stable on paxil, refilled today, discussed Serotonin Syndrome    Relevant Medications   PARoxetine (PAXIL) 10 MG tablet    Other Visit Diagnoses    Need for prophylactic vaccination and inoculation against influenza    -  Primary   Relevant Orders   Flu Vaccine QUAD 36+ mos IM (Completed)   Encounter for medication refill    -   Discussed refill plans       Meds ordered this encounter  Medications  . PARoxetine (PAXIL) 10 MG tablet    Sig: Take 1 tablet (10 mg total) by mouth every morning.    Dispense:  90 tablet    Refill:  3    Follow-up: No follow-ups on file.    Forrest Moron, MD

## 2018-10-21 NOTE — Patient Instructions (Addendum)
If you have lab work done today you will be contacted with your lab results within the next 2 weeks.  If you have not heard from [place] then please contact us. The fastest way to get your results is to register for My Chart.   IF you received an x-ray today, you will receive an invoice from [facility]. Please contact [facility]iology at [phone] with questions or concerns regarding your invoice.   IF you received labwork today, you will receive an invoice from [place]. Please contact [facility] at [phone] with questions or concerns regarding your invoice.   Our billing staff will not be able to assist you with questions regarding bills from these companies.  You will be contacted with the lab results as soon as they are available. The fastest way to get your results is to activate your My Chart account. Instructions are located on the last page of this paperwork. If you have not heard from [place] regarding the results in 2 weeks, please contact this office.     Live Zoster (Shingles) Vaccine: What You Need to Know 1. Why get vaccinated? Live zoster (shingles) vaccine can prevent shingles. Shingles (also called herpes zoster, or just zoster) is a painful skin rash, usually with blisters. In addition to the rash, shingles can cause fever, headache, chills, or upset stomach. More rarely, shingles can lead to pneumonia, hearing problems, blindness, brain inflammation (encephalitis), or death.  The most common complication of shingles is long-term nerve pain called postherpetic neuralgia (PHN). PHN occurs in the areas where the shingles rash was, even after the rash clears up. It can last for months or years after the rash goes away. The pain from PHN can be severe and debilitating. About 10 to 18% of people who get shingles will experience PHN. The risk of PHN increases with age. An older adult with shingles is more likely to develop PHN and have longer lasting and more severe  pain than a younger person with shingles.  Shingles is caused by the varicella zoster virus, the same virus that causes chickenpox. After you have chickenpox, the virus stays in your body and can cause shingles later in life. Shingles cannot be passed from one person to another, but the virus that causes shingles can spread and cause chickenpox in someone who had never had chickenpox or received chickenpox vaccine. 2. Live shingles vaccine Live shingles vaccine can provide protection against shingles and PHN.  Another type of shingles vaccine, recombinant shingles vaccine, is the preferred vaccine for the prevention of shingles. However, live shingles vaccine may be used in some circumstances (for example if a person is allergic to recombinant shingles vaccine or prefers live shingles vaccine, or if recombinant shingles vaccine is not available). Adults 60 years and older who get live shingles vaccine should receive 1 dose, administered by injection. Shingles vaccine may be given at the same time as other vaccines. 3. Talk with your health care provider Tell your vaccine provider if the person getting the vaccine:  Has had an allergic reaction after a previous dose of live shingles vaccine or varicella vaccine, or has any severe, life-threatening allergies.  Has a weakened immune system.  Is pregnant or thinks she might be pregnant.  Is currently experiencing an episode of shingles. In some cases, your health care provider may decide to postpone shingles vaccination to a future visit.  People with minor illnesses, such as a cold, may be vaccinated. People who are moderately or severely  ill should usually wait until they recover before getting live shingles vaccine.  Your health care provider can give you more information. 4. Risks of a vaccine reaction  Redness, soreness, swelling, or itching at the site of the injection and headache can happen after live shingles vaccine. Rarely, live  shingles vaccine can cause rash or shingles.  People sometimes faint after medical procedures, including vaccination. Tell your provider if you feel dizzy or have vision changes or ringing in the ears.  As with any medicine, there is a very remote chance of a vaccine causing a severe allergic reaction, other serious injury, or death. 5. What if there is a serious problem? An allergic reaction could occur after the vaccinated person leaves the clinic. If you see signs of a severe allergic reaction (hives, swelling of the face and throat, difficulty breathing, a fast heartbeat, dizziness, or weakness), call 9-1-1 and get the person to the nearest hospital.  For other signs that concern you, call your health care provider.  Adverse reactions should be reported to the Vaccine Adverse Event Reporting System (VAERS). Your health care provider will usually file this report, or you can do it yourself. Visit the VAERS website at www.vaers.SamedayNews.es or call 425 094 6006. VAERS is only for reporting reactions, and VAERS staff do not give medical advice. 6. How can I learn more?  Ask your health care provider.  Call your local or state health department.  Contact the Centers for Disease Control and Prevention (CDC): ? Call 917-564-0743 (1-800-CDC-INFO) or ? Visit CDC's website at http://hunter.com/ CDC Vaccine Information Statement Live Zoster Vaccine (11/19/2017) This information is not intended to replace advice given to you by your health care provider. Make sure you discuss any questions you have with your health care provider. Document Released: 11/04/2005 Document Revised: 04/28/2018 Document Reviewed: 08/19/2017 Elsevier Patient Education  2020 [employer].

## 2019-08-31 ENCOUNTER — Other Ambulatory Visit: Payer: Self-pay

## 2019-08-31 ENCOUNTER — Telehealth: Payer: Self-pay | Admitting: Family Medicine

## 2019-08-31 DIAGNOSIS — Z0000 Encounter for general adult medical examination without abnormal findings: Secondary | ICD-10-CM

## 2019-08-31 NOTE — Telephone Encounter (Signed)
Orders placed, pt can come in any time

## 2019-08-31 NOTE — Telephone Encounter (Signed)
Pt is needing her lab orders placed for 8/24 a nurse visit. Her cpe is scheduled with [facility] on 09/17/19.

## 2019-09-14 ENCOUNTER — Ambulatory Visit: Payer: BC Managed Care – PPO

## 2019-09-14 ENCOUNTER — Other Ambulatory Visit: Payer: Self-pay

## 2019-09-14 DIAGNOSIS — Z0000 Encounter for general adult medical examination without abnormal findings: Secondary | ICD-10-CM

## 2019-09-15 LAB — CBC WITH DIFFERENTIAL/PLATELET
Basophils Absolute: 0.1 10*3/uL (ref 0.0–0.2)
Basos: 1 %
EOS (ABSOLUTE): 0.2 10*3/uL (ref 0.0–0.4)
Eos: 4 %
Hematocrit: 35 % (ref 34.0–46.6)
Hemoglobin: 11.4 g/dL (ref 11.1–15.9)
Immature Grans (Abs): 0 10*3/uL (ref 0.0–0.1)
Immature Granulocytes: 0 %
Lymphocytes Absolute: 1.8 10*3/uL (ref 0.7–3.1)
Lymphs: 26 %
MCH: 28.8 pg (ref 26.6–33.0)
MCHC: 32.6 g/dL (ref 31.5–35.7)
MCV: 88 fL (ref 79–97)
Monocytes Absolute: 0.5 10*3/uL (ref 0.1–0.9)
Monocytes: 7 %
Neutrophils Absolute: 4.3 10*3/uL (ref 1.4–7.0)
Neutrophils: 62 %
Platelets: 348 10*3/uL (ref 150–450)
RBC: 3.96 x10E6/uL (ref 3.77–5.28)
RDW: 13.5 % (ref 11.7–15.4)
WBC: 6.9 10*3/uL (ref 3.4–10.8)

## 2019-09-15 LAB — COMPREHENSIVE METABOLIC PANEL
ALT: 17 IU/L (ref 0–32)
AST: 20 IU/L (ref 0–40)
Albumin/Globulin Ratio: 1.5 (ref 1.2–2.2)
Albumin: 4.3 g/dL (ref 3.8–4.9)
Alkaline Phosphatase: 51 IU/L (ref 48–121)
BUN/Creatinine Ratio: 9 (ref 9–23)
BUN: 8 mg/dL (ref 6–24)
Bilirubin Total: 0.3 mg/dL (ref 0.0–1.2)
CO2: 21 mmol/L (ref 20–29)
Calcium: 9.3 mg/dL (ref 8.7–10.2)
Chloride: 105 mmol/L (ref 96–106)
Creatinine, Ser: 0.93 mg/dL (ref 0.57–1.00)
GFR calc Af Amer: 82 mL/min/{1.73_m2} (ref >59)
GFR calc non Af Amer: 71 mL/min/{1.73_m2} (ref >59)
Globulin, Total: 2.8 g/dL (ref 1.5–4.5)
Glucose: 95 mg/dL (ref 65–99)
Potassium: 4.6 mmol/L (ref 3.5–5.2)
Sodium: 140 mmol/L (ref 134–144)
Total Protein: 7.1 g/dL (ref 6.0–8.5)

## 2019-09-15 LAB — LIPID PANEL
Chol/HDL Ratio: 3.4 ratio (ref 0.0–4.4)
Cholesterol, Total: 222 mg/dL — ABNORMAL HIGH (ref 100–199)
HDL: 66 mg/dL (ref >39)
LDL Chol Calc (NIH): 122 mg/dL — ABNORMAL HIGH (ref 0–99)
Triglycerides: 194 mg/dL — ABNORMAL HIGH (ref 0–149)
VLDL Cholesterol Cal: 34 mg/dL (ref 5–40)

## 2019-09-15 LAB — HEMOGLOBIN A1C
Est. average glucose Bld gHb Est-mCnc: 108 mg/dL
Hgb A1c MFr Bld: 5.4 % (ref 4.8–5.6)

## 2019-09-17 ENCOUNTER — Ambulatory Visit (INDEPENDENT_AMBULATORY_CARE_PROVIDER_SITE_OTHER): Payer: BC Managed Care – PPO | Admitting: Family Medicine

## 2019-09-17 ENCOUNTER — Encounter: Payer: Self-pay | Admitting: Family Medicine

## 2019-09-17 ENCOUNTER — Other Ambulatory Visit: Payer: Self-pay

## 2019-09-17 VITALS — BP 138/72 | HR 83 | Temp 98.3°F | Resp 15 | Ht 63.0 in | Wt 170.2 lb

## 2019-09-17 DIAGNOSIS — Z0000 Encounter for general adult medical examination without abnormal findings: Secondary | ICD-10-CM

## 2019-09-17 DIAGNOSIS — Z23 Encounter for immunization: Secondary | ICD-10-CM | POA: Diagnosis not present

## 2019-09-17 DIAGNOSIS — F411 Generalized anxiety disorder: Secondary | ICD-10-CM | POA: Diagnosis not present

## 2019-09-17 DIAGNOSIS — Z0001 Encounter for general adult medical examination with abnormal findings: Secondary | ICD-10-CM

## 2019-09-17 MED ORDER — PAROXETINE HCL 10 MG PO TABS: 10.0000 mg | ORAL_TABLET | ORAL | 3 refills | Status: DC

## 2019-09-17 NOTE — Patient Instructions (Addendum)
  Try and increase your exercise and decrease your food intake to get down a few more pounds.  Continue taking same antidepressant/antianxiety medication.  Return in about 1 year for regular exam.  Since your hemoglobin is a little lower than it usually runs, I would suggest taking an over-the-counter iron supplement 1 daily.  Continue to try to be physically, relationally, emotionally, and spiritually healthy.   If you have lab work done today you will be contacted with your lab results within the next 2 weeks.  If you have not heard from [place] then please contact us. The fastest way to get your results is to register for My Chart.   IF you received an x-ray today, you will receive an invoice from [facility] Rad[facility]. Please contact [facility] Radiology at [phone] with questions or concerns regarding your invoice.   IF you received labwork today, you will receive an invoice from [place]. Please contact [facility] at [phone] with questions or concerns regarding your invoice.   Our billing staff will not be able to assist you with questions regarding bills from these companies.  You will be contacted with the lab results as soon as they are available. The fastest way to get your results is to activate your My Chart account. Instructions are located on the last page of this paperwork. If you have not heard from [place] regarding the results in 2 weeks, please contact this office.

## 2019-09-17 NOTE — Progress Notes (Signed)
Patient ID: Lori Wheeler, female    DOB: 27-May-1965  Age: 54 y.o. MRN: [id number]  Chief Complaint  Patient presents with  . Annual Exam    pt feeling well today no concerns, pt needs refill Paxil today pt reports no side effects working well both GAD-7 and PHQ-9 were 0     Subjective:   Pleasant lady 54 years old who is here for annual physical exam.  Major acute complaints.  She wanted to go over her labs.  Operations: General surgery She has had a colonoscopy 2 years ago Medical illnesses none: Psychiatric illnesses: Hospitalized once for anxiety and depression years ago and has done well ever since she was on Paxil which she continued on low-dose of. Gravida 0 para 0 Last menstrual period 1 month Medications: Paxil 10 mg daily since 2006 Medication allergies: None  Family history: Mother is living and well.  Father died of a brain tumor.  4 siblings.  A couple of the brothers have high blood pressure and high cholesterol and one had a kidney cancer and one has diabetes.  Social history: Married for 25 years.  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  She has been in good health.  Does not get much exercise.  [place] enjoy the beach house and she says she eats and drinks too much when she is down there.  Does not use any drugs.  Does not smoke.  Drinks primarily on weekends.  Review of systems: Constitutional: Unremarkable HEENT: Unremarkable Cardiovascular: Unremarkable Respiratory: Unremarkable GI: Unremarkable GU: Unremarkable Musculoskeletal: Unremarkable Neurologic: Unremarkable Psychiatric: Doing quite well Dermatologic: Unremarkable Endocrine: Unremarkable    Current allergies, medications, problem list, past/family and social histories reviewed.  Objective:  BP 138/72   Pulse 83   Temp 98.3 F (36.8 C) (Temporal)   Resp 15   Ht 5\' 3"  (1.6 m)   Wt 170 lb 3.2 oz (77.2 kg)   SpO2 97%   BMI 30.15 kg/m   Healthy-appearing lady, somewhat overweight, no acute distress.  TMs  normal.  Eyes PERRL.  Throat clear.  Neck supple without nodes or thyromegaly.  No carotid bruits.  Chest clear to auscultation.  Heart rate without murmurs.  Breast pelvic exam not done.  Extremities unremarkable.  Skin unremarkable.  Abdomen soft without mass or tenderness.  She goes to GYN Assessment & Plan:   Assessment: 1. Annual physical exam   2. Generalized anxiety disorder       Plan: See instructions.  Get a flu shot today.  She has had a Covid vaccine.  Orders Placed This Encounter  Procedures  . Flu Vaccine QUAD 6+ mos PF IM (Fluarix Quad PF)    Meds ordered this encounter  Medications  . PARoxetine (PAXIL) 10 MG tablet    Sig: Take 1 tablet (10 mg total) by mouth every morning.    Dispense:  90 tablet    Refill:  3         Patient Instructions    Try and increase your exercise and decrease your food intake to get down a few more pounds.  Continue taking same antidepressant/antianxiety medication.  Return in about 1 year for regular exam.  Since your hemoglobin is a little lower than it usually runs, I would suggest taking an over-the-counter iron supplement 1 daily.  Continue to try to be physically, relationally, emotionally, and spiritually healthy.   If you have lab work done today you will be contacted with your lab results within the next 2 weeks.  If you have not heard from [place] then please contact us. The fastest way to get your results is to register for My Chart.   IF you received an x-ray today, you will receive an invoice from [facility] Radiology. Please contact [facility] Radiology at [phone] with questions or concerns regarding your invoice.   IF you received labwork today, you will receive an invoice from [place]. Please contact [facility] at [phone] with questions or concerns regarding your invoice.   Our billing staff will not be able to assist you with questions regarding bills from these companies.  You will be contacted  with the lab results as soon as they are available. The fastest way to get your results is to activate your My Chart account. Instructions are located on the last page of this paperwork. If you have not heard from [place] regarding the results in 2 weeks, please contact this office.        Return in about 1 year (around 09/16/2020).   Ruben Reason, MD 09/17/2019

## 2019-12-28 LAB — HM MAMMOGRAPHY

## 2020-02-18 ENCOUNTER — Ambulatory Visit: Payer: BC Managed Care – PPO | Admitting: Family Medicine

## 2020-02-18 ENCOUNTER — Ambulatory Visit (INDEPENDENT_AMBULATORY_CARE_PROVIDER_SITE_OTHER): Payer: BC Managed Care – PPO

## 2020-02-18 ENCOUNTER — Encounter: Payer: Self-pay | Admitting: Family Medicine

## 2020-02-18 ENCOUNTER — Other Ambulatory Visit: Payer: Self-pay

## 2020-02-18 VITALS — BP 142/90 | HR 88 | Temp 98.2°F | Ht 63.0 in | Wt 175.8 lb

## 2020-02-18 DIAGNOSIS — F411 Generalized anxiety disorder: Secondary | ICD-10-CM

## 2020-02-18 DIAGNOSIS — Z23 Encounter for immunization: Secondary | ICD-10-CM

## 2020-02-18 DIAGNOSIS — M545 Low back pain, unspecified: Secondary | ICD-10-CM

## 2020-02-18 MED ORDER — CYCLOBENZAPRINE HCL 5 MG PO TABS: ORAL_TABLET | ORAL | 0 refills | Status: DC

## 2020-02-18 NOTE — Patient Instructions (Addendum)
Range of motion, stretches throughout the day can help with the back.  Tylenol over-the-counter can be used or Advil if needed short-term.  See information below on back pain.  I will check an x-ray but I suspect that will be okay.  I did write for a few muscle relaxants if needed.  Follow-up in 6 weeks unless symptoms are worsening sooner.  Thanks for coming in today and let me know if there are questions.   Acute Back Pain, Adult Acute back pain is sudden and usually short-lived. It is often caused by an injury to the muscles and tissues in the back. The injury may result from:  A muscle or ligament getting overstretched or torn (strained). Ligaments are tissues that connect bones to each other. Lifting something improperly can cause a back strain.  Wear and tear (degeneration) of the spinal disks. Spinal disks are circular tissue that provide cushioning between the bones of the spine (vertebrae).  Twisting motions, such as while playing sports or doing yard work.  A hit to the back.  Arthritis. You may have a physical exam, lab tests, and imaging tests to find the cause of your pain. Acute back pain usually goes away with rest and home care. Follow these instructions at home: Managing pain, stiffness, and swelling  Treatment may include medicines for pain and inflammation that are taken by mouth or applied to the skin, prescription pain medicine, or muscle relaxants. Take over-the-counter and prescription medicines only as told by your health care provider.  Your health care provider may recommend applying ice during the first 24-48 hours after your pain starts. To do this: ? Put ice in a plastic bag. ? Place a towel between your skin and the bag. ? Leave the ice on for 20 minutes, 2-3 times a day.  If directed, apply heat to the affected area as often as told by your health care provider. Use the heat source that your health care provider recommends, such as a moist heat pack or a  heating pad. ? Place a towel between your skin and the heat source. ? Leave the heat on for 20-30 minutes. ? Remove the heat if your skin turns bright red. This is especially important if you are unable to feel pain, heat, or cold. You have a greater risk of getting burned. Activity  Do not stay in bed. Staying in bed for more than 1-2 days can delay your recovery.  Sit up and stand up straight. Avoid leaning forward when you sit or hunching over when you stand. ? If you work at a desk, sit close to it so you do not need to lean over. Keep your chin tucked in. Keep your neck drawn back, and keep your elbows bent at a 90-degree angle (right angle). ? Sit high and close to the steering wheel when you drive. Add lower back (lumbar) support to your car seat, if needed.  Take short walks on even surfaces as soon as you are able. Try to increase the length of time you walk each day.  Do not sit, drive, or stand in one place for more than 30 minutes at a time. Sitting or standing for long periods of time can put stress on your back.  Do not drive or use heavy machinery while taking prescription pain medicine.  Use proper lifting techniques. When you bend and lift, use positions that put less stress on your back: ? [place] your knees. ? Keep the load close to your  body. ? Avoid twisting.  Exercise regularly as told by your health care provider. Exercising helps your back heal faster and helps prevent back injuries by keeping muscles strong and flexible.  Work with a physical therapist to make a safe exercise program, as recommended by your health care provider. Do any exercises as told by your physical therapist.   Lifestyle  Maintain a healthy weight. Extra weight puts stress on your back and makes it difficult to have good posture.  Avoid activities or situations that make you feel anxious or stressed. Stress and anxiety increase muscle tension and can make back pain worse. Learn ways to manage  anxiety and stress, such as through exercise. General instructions  Sleep on a firm mattress in a comfortable position. Try lying on your side with your knees slightly bent. If you lie on your back, put a pillow under your knees.  Follow your treatment plan as told by your health care provider. This may include: ? Cognitive or behavioral therapy. ? Acupuncture or massage therapy. ? Meditation or yoga. Contact a health care provider if:  You have pain that is not relieved with rest or medicine.  You have increasing pain going down into your legs or buttocks.  Your pain does not improve after 2 weeks.  You have pain at night.  You lose weight without trying.  You have a fever or chills. Get help right away if:  You develop new bowel or bladder control problems.  You have unusual weakness or numbness in your arms or legs.  You develop nausea or vomiting.  You develop abdominal pain.  You feel faint. Summary  Acute back pain is sudden and usually short-lived.  Use proper lifting techniques. When you bend and lift, use positions that put less stress on your back.  Take over-the-counter and prescription medicines and apply heat or ice as directed by your health care provider. This information is not intended to replace advice given to you by your health care provider. Make sure you discuss any questions you have with your health care provider. Document Revised: 10/01/2019 Document Reviewed: 10/01/2019 Elsevier Patient Education  2021 [employer].    If you have lab work done today you will be contacted with your lab results within the next 2 weeks.  If you have not heard from [place] then please contact us. The fastest way to get your results is to register for My Chart.   IF you received an x-ray today, you will receive an invoice from [facility] Radiology. Please contact [facility] Radiology at [phone] with questions or concerns regarding your invoice.   IF you  received labwork today, you will receive an invoice from [place]. Please contact [facility] at [phone] with questions or concerns regarding your invoice.   Our billing staff will not be able to assist you with questions regarding bills from these companies.  You will be contacted with the lab results as soon as they are available. The fastest way to get your results is to activate your My Chart account. Instructions are located on the last page of this paperwork. If you have not heard from [place] regarding the results in 2 weeks, please contact this office.

## 2020-02-18 NOTE — Progress Notes (Signed)
Subjective:  Patient ID: Lori Wheeler, female    DOB: Nov 11, 1965  Age: 54 y.o. MRN: [id number]  CC:  Chief Complaint  Patient presents with  . Transitions Of Care    Lower back pain and most likely going through menopause     HPI Lori Wheeler presents for  Transfer of care, previous provider Dr. Nolon Rod. Here with concerns as above.  Low back pain Past month, lower bilaterally to sides.  notes with bending, turning at times. No leg radiation.  New condo in [place], 2019 - [place] beach. Some weight gain over past year or two. 20#.  Low back ache, sometimes stabbing pain. Noticed pain after driving home form beach about a month ago.  No bowel or bladder incontinence, no saddle anesthesia, no lower extremity weakness.  No unexplained night sweats, fever, weight loss.  Prior Yoga, once per week less past few months,  Tx: rare ibuprofen.  No previous PT, injections, or back issues.  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  Stiff in am, sometimes hard to sleep.   Possible menopause. Followed by gynecology - Laurin Coder at [facility].  Taken off OCP's 1 month ago, to see if going into menopause. appt 02/29/20.  Unknown maternal timing. No recent menses - had been on ocp's.   Generalized anxiety disorder Paxil 10 mg daily.  Working well. Same dose for 10 years.   GAD 7 : Generalized Anxiety Score 09/17/2019  Nervous, Anxious, on Edge 0  Control/stop worrying 0  Worry too much - different things 0  Trouble relaxing 0  Restless 0  Easily annoyed or irritable 0  Afraid - awful might happen 0  Total GAD 7 Score 0   covid 19 vaccine and booster received.unknown date.    Shingles vaccine, requests today.   History Patient Active Problem List   Diagnosis Date Noted  . Generalized anxiety disorder 02/17/2015  . Right elbow pain 08/28/2012  . MUSCLE STRAIN, HAMSTRING MUSCLE 06/06/2006   Past Medical History:   Diagnosis Date  . Allergy    Claritin daily  . Anxiety    Past Surgical History:  Procedure Laterality Date  . Admission     Behavioral Health admission; 2 weeks.  Marland Kitchen MANDIBLE SURGERY  1991   No Known Allergies Prior to Admission medications   Medication Sig Start Date End Date Taking? Authorizing Provider  b complex vitamins tablet Take 1 tablet by mouth daily.   Yes [provider]  Calcium-Magnesium 100-50 MG TABS Take 1 tablet by mouth daily.   Yes [provider]  Clindamycin Phosphate foam Apply 1 application topically 2 (two) times daily. 09/18/17  Yes Stallings, Zoe A, MD  loratadine (CLARITIN) 10 MG tablet Take 10 mg by mouth daily.   Yes [provider]  Multiple Vitamin (MULTIVITAMIN) tablet Take 1 tablet by mouth daily.   Yes [provider]  PARoxetine (PAXIL) 10 MG tablet Take 1 tablet (10 mg total) by mouth every morning. 09/17/19  Yes Posey Boyer, MD  VITAMIN D, CHOLECALCIFEROL, PO Take 1,000 Units by mouth daily.   Yes [provider]   Social History   Socioeconomic History  . Marital status: Married    Spouse name: Not on file  . Number of children: 0  . Years of education: Not on file  . Highest education level: Not on file  Occupational History  . Occupation: Dance movement psychotherapist  Tobacco Use  . Smoking status: Former Smoker    Years: 1.00    Types: Cigarettes    Quit date: 01/22/1983    Years since quitting: 37.0  . Smokeless tobacco: Never Used  . Tobacco comment: high school only   Vaping Use  . Vaping Use: Never used  Substance and Sexual Activity  . Alcohol use: Yes    Comment: beer - 6 pack/week  . Drug use: Never  . Sexual activity: Yes    Birth control/protection: Pill  Other Topics Concern  . Not on file  Social History Narrative   Marital status:  Married x 28 years      Children: none; one dog      Lives: with husband, dog.       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      Diet:  VEGETARIAN; Tofu and beans; smoothies with protein powder; B complex   Social Determinants of Health   Financial Resource Strain: Not on file  Food Insecurity: Not on file  Transportation Needs: Not on file  Physical Activity: Not on file  Stress: Not on file  Social Connections: Not on file  Intimate Partner Violence: Not on file    Review of Systems As in HPI  Objective:   Vitals:   02/18/20 1358 02/18/20 1400  BP: (!) 148/87 (!) 142/90  Pulse: 88   Temp: 98.2 F (36.8 C)   TempSrc: Temporal   SpO2: 99%   Weight: 175 lb 12.8 oz (79.7 kg)   Height: 5\' 3"  (1.6 m)      Physical Exam Vitals reviewed.  Constitutional:      Appearance: She is well-developed and well-nourished.  HENT:     Head: Normocephalic and atraumatic.  Eyes:     Extraocular Movements: EOM normal.     Conjunctiva/sclera: Conjunctivae normal.     Pupils: Pupils are equal, round, and reactive to light.  Neck:     Vascular: No carotid bruit.  Cardiovascular:     Rate and Rhythm: Normal rate and regular rhythm.     Pulses: Intact distal pulses.     Heart sounds: Normal heart sounds.  Pulmonary:     Effort: Pulmonary effort is normal.     Breath sounds: Normal breath sounds.  Abdominal:     Palpations: Abdomen is soft. There is no pulsatile mass.     Tenderness: There is no abdominal tenderness.  Musculoskeletal:     Comments: Lumbar spine no midline bony tenderness, describes area of discomfort at the paraspinals of the lower lumbar spine but nontender on exam without appreciable spasm.  Negative seated straight leg raise.  Range of motion intact.  Able to heel and toe walk without difficulty.  Reflexes 2+ at patella, Achilles, negative Babinski   Skin:    General: Skin is warm and dry.  Neurological:     Mental Status: She is alert and oriented to person,  place, and time.  Psychiatric:        Mood and Affect: Mood and affect and mood normal.        Behavior: Behavior normal.    32 minutes spent during visit, greater than 50% counseling and assimilation of information, chart review, and discussion of plan.  Assessment & Plan:  Lori Wheeler is a 55 y.o. female . Generalized anxiety disorder  -Stable on current dose of Paxil, continue same.  -Off OCPs, monitoring for menopause with planned follow-up with OB/GYN.  Bilateral low back pain without sciatica, unspecified chronicity - Plan: cyclobenzaprine (FLEXERIL) 5 MG tablet, DG Lumbar Spine 2-3 Views  -Possible degenerative disc disease, mechanical LBP with some weight gain, decreased yoga as possible contributor.  No red flags on exam/history.  Will check imaging with episodic sharp pain.   -Symptomatic care discussed with range of motion, stretches, handout given.  Flexeril if needed at bedtime with potential side effects discussed.  Tylenol if needed.  Recheck 6 weeks with RTC precautions.  Need for shingles vaccine - Plan: Varicella-zoster vaccine IM  -First dose given today.  Potential side effects discussed.  Meds ordered this encounter  Medications  . cyclobenzaprine (FLEXERIL) 5 MG tablet    Sig: 1 pill by mouth up to every 8 hours as needed. Start with one pill by mouth each bedtime as needed due to sedation    Dispense:  15 tablet    Refill:  0   Patient Instructions    Range of motion, stretches throughout the day can help with the back.  Tylenol over-the-counter can be used or Advil if needed short-term.  See information below on back pain.  I will check an x-ray but I suspect that will be okay.  I did write for a few muscle relaxants if needed.  Follow-up in 6 weeks unless symptoms are worsening sooner.  Thanks for coming in today and let me know if there are questions.   Acute Back Pain, Adult Acute back pain is sudden and usually short-lived. It is often caused  by an injury to the muscles and tissues in the back. The injury may result from:  A muscle or ligament getting overstretched or torn (strained). Ligaments are tissues that connect bones to each other. Lifting something improperly can cause a back strain.  Wear and tear (degeneration) of the spinal disks. Spinal disks are circular tissue that provide cushioning between the bones of the spine (vertebrae).  Twisting motions, such as while playing sports or doing yard work.  A hit to the back.  Arthritis. You may have a physical exam, lab tests, and imaging tests to find the cause of your pain. Acute back pain usually goes away with rest and home care. Follow these instructions at home: Managing pain, stiffness, and swelling  Treatment may include medicines for pain and inflammation that are taken by mouth or applied to the skin, prescription pain medicine, or muscle relaxants. Take over-the-counter and prescription medicines only as told by your health care provider.  Your health care provider may recommend applying ice during the first 24-48 hours after your pain starts. To do this: ? Put ice in a plastic bag. ? Place a towel between your skin and the bag. ? Leave the ice on for 20 minutes, 2-3 times a day.  If directed, apply heat to the affected area as often as told by your health care provider. Use the heat source that your health care provider recommends, such as a moist heat pack or a heating pad. ? Place a towel between your skin and the heat source. ? Leave the heat on for 20-30 minutes. ? Remove the heat if your skin turns bright red. This is especially important if you are unable to feel pain, heat, or cold. You have a greater risk  of getting burned. Activity  Do not stay in bed. Staying in bed for more than 1-2 days can delay your recovery.  Sit up and stand up straight. Avoid leaning forward when you sit or hunching over when you stand. ? If you work at a desk, sit close to  it so you do not need to lean over. Keep your chin tucked in. Keep your neck drawn back, and keep your elbows bent at a 90-degree angle (right angle). ? Sit high and close to the steering wheel when you drive. Add lower back (lumbar) support to your car seat, if needed.  Take short walks on even surfaces as soon as you are able. Try to increase the length of time you walk each day.  Do not sit, drive, or stand in one place for more than 30 minutes at a time. Sitting or standing for long periods of time can put stress on your back.  Do not drive or use heavy machinery while taking prescription pain medicine.  Use proper lifting techniques. When you bend and lift, use positions that put less stress on your back: ? Fertile your knees. ? Keep the load close to your body. ? Avoid twisting.  Exercise regularly as told by your health care provider. Exercising helps your back heal faster and helps prevent back injuries by keeping muscles strong and flexible.  Work with a physical therapist to make a safe exercise program, as recommended by your health care provider. Do any exercises as told by your physical therapist.   Lifestyle  Maintain a healthy weight. Extra weight puts stress on your back and makes it difficult to have good posture.  Avoid activities or situations that make you feel anxious or stressed. Stress and anxiety increase muscle tension and can make back pain worse. Learn ways to manage anxiety and stress, such as through exercise. General instructions  Sleep on a firm mattress in a comfortable position. Try lying on your side with your knees slightly bent. If you lie on your back, put a pillow under your knees.  Follow your treatment plan as told by your health care provider. This may include: ? Cognitive or behavioral therapy. ? Acupuncture or massage therapy. ? Meditation or yoga. Contact a health care provider if:  You have pain that is not relieved with rest or  medicine.  You have increasing pain going down into your legs or buttocks.  Your pain does not improve after 2 weeks.  You have pain at night.  You lose weight without trying.  You have a fever or chills. Get help right away if:  You develop new bowel or bladder control problems.  You have unusual weakness or numbness in your arms or legs.  You develop nausea or vomiting.  You develop abdominal pain.  You feel faint. Summary  Acute back pain is sudden and usually short-lived.  Use proper lifting techniques. When you bend and lift, use positions that put less stress on your back.  Take over-the-counter and prescription medicines and apply heat or ice as directed by your health care provider. This information is not intended to replace advice given to you by your health care provider. Make sure you discuss any questions you have with your health care provider. Document Revised: 10/01/2019 Document Reviewed: 10/01/2019 Elsevier Patient Education  2021 [employer].    If you have lab work done today you will be contacted with your lab results within the next 2 weeks.  If you have  not heard from [place] then please contact us. The fastest way to get your results is to register for My Chart.   IF you received an x-ray today, you will receive an invoice from [facility]. Please contact [facility] at [phone] with questions or concerns regarding your invoice.   IF you received labwork today, you will receive an invoice from [facility]. Please contact [facility] at [phone] with questions or concerns regarding your invoice.   Our billing staff will not be able to assist you with questions regarding bills from these companies.  You will be contacted with the lab results as soon as they are available. The fastest way to get your results is to activate your My Chart account. Instructions are located on the last page of this paperwork. If you have not heard from [place]  regarding the results in 2 weeks, please contact this office.          Signed, Merri Ray, MD [facility]

## 2020-03-30 ENCOUNTER — Ambulatory Visit: Payer: BC Managed Care – PPO | Admitting: Family Medicine

## 2020-08-09 ENCOUNTER — Encounter: Payer: BC Managed Care – PPO | Admitting: Emergency Medicine

## 2020-08-28 ENCOUNTER — Ambulatory Visit (INDEPENDENT_AMBULATORY_CARE_PROVIDER_SITE_OTHER): Payer: BC Managed Care – PPO | Admitting: Family Medicine

## 2020-08-28 ENCOUNTER — Ambulatory Visit (INDEPENDENT_AMBULATORY_CARE_PROVIDER_SITE_OTHER)
Admission: RE | Admit: 2020-08-28 | Discharge: 2020-08-28 | Disposition: A | Discharge status: Home / Self Care | Payer: BC Managed Care – PPO | Source: Ambulatory Visit | Attending: Family Medicine | Admitting: Family Medicine

## 2020-08-28 ENCOUNTER — Other Ambulatory Visit: Payer: Self-pay

## 2020-08-28 VITALS — BP 136/74 | HR 88 | Temp 98.3°F | Resp 17 | Ht 63.0 in | Wt 173.0 lb

## 2020-08-28 DIAGNOSIS — F411 Generalized anxiety disorder: Secondary | ICD-10-CM | POA: Diagnosis not present

## 2020-08-28 DIAGNOSIS — M65339 Trigger finger, unspecified middle finger: Secondary | ICD-10-CM

## 2020-08-28 DIAGNOSIS — M79642 Pain in left hand: Secondary | ICD-10-CM

## 2020-08-28 DIAGNOSIS — M79641 Pain in right hand: Secondary | ICD-10-CM

## 2020-08-28 DIAGNOSIS — E785 Hyperlipidemia, unspecified: Secondary | ICD-10-CM

## 2020-08-28 LAB — LIPID PANEL
Cholesterol: 286 mg/dL — ABNORMAL HIGH (ref 0–200)
HDL: 78.4 mg/dL (ref >39.00)
NonHDL: 207.91
Total CHOL/HDL Ratio: 4
Triglycerides: 204 mg/dL — ABNORMAL HIGH (ref 0.0–149.0)
VLDL: 40.8 mg/dL — ABNORMAL HIGH (ref 0.0–40.0)

## 2020-08-28 LAB — COMPREHENSIVE METABOLIC PANEL
ALT: 19 U/L (ref 0–35)
AST: 22 U/L (ref 0–37)
Albumin: 4.7 g/dL (ref 3.5–5.2)
Alkaline Phosphatase: 83 U/L (ref 39–117)
BUN: 9 mg/dL (ref 6–23)
CO2: 26 mEq/L (ref 19–32)
Calcium: 10.2 mg/dL (ref 8.4–10.5)
Chloride: 99 mEq/L (ref 96–112)
Creatinine, Ser: 0.71 mg/dL (ref 0.40–1.20)
GFR: 97.16 mL/min (ref >60.00)
Glucose, Bld: 75 mg/dL (ref 70–99)
Potassium: 3.8 mEq/L (ref 3.5–5.1)
Sodium: 137 mEq/L (ref 135–145)
Total Bilirubin: 0.5 mg/dL (ref 0.2–1.2)
Total Protein: 7.6 g/dL (ref 6.0–8.3)

## 2020-08-28 LAB — LDL CHOLESTEROL, DIRECT: Direct LDL: 184 mg/dL

## 2020-08-28 LAB — SEDIMENTATION RATE: Sed Rate: 41 mm/hr — ABNORMAL HIGH (ref 0–30)

## 2020-08-28 MED ORDER — MELOXICAM 7.5 MG PO TABS: 7.5000 mg | ORAL_TABLET | Freq: Every day | ORAL | 0 refills | Status: DC

## 2020-08-28 MED ORDER — PAROXETINE HCL 10 MG PO TABS: 10.0000 mg | ORAL_TABLET | ORAL | 3 refills | Status: DC

## 2020-08-28 NOTE — Progress Notes (Signed)
Subjective:  Patient ID: Lori Wheeler, female    DOB: 1965/07/04  Age: 55 y.o. MRN: [id number]  CC:  Chief Complaint  Patient presents with   Hand Pain    Pt reports over last few months, worst in mornings feeling tight, tenderness with movement    Anxiety    Pt notes paxil doing well for her and she is in need of a refill today with no concerns     HPI Lori Wheeler presents for   Hand pain Both hands, few months. Thought d/t menopause initially.  Works on Teaching laboratory technician. No change in activity or work.  More sore/stiff in am, still sore later in day. Tight - ? Swelling.  NKI. No rash. Popping in PIP of both middle fingers - frees on own. No other joint involvement.   No hx PUD, renal dz.   Hyperlipidemia: Diet, exercise approach.  No current statin.  The 10-year ASCVD risk score Mikey Bussing DC Brooke Bonito., et al., 2013) is: 1.9%   Values used to calculate the score:     Age: 65 years     Sex: Female     Is Non-Hispanic African American: No     Diabetic: No     Tobacco smoker: No     Systolic Blood Pressure: [id number] mmHg     Is BP treated: No     HDL Cholesterol: 78.4 mg/dL     Total Cholesterol: 286 mg/dL No FH of early heart disease.  Last ate 5hrs ago.   Lab Results  Component Value Date   CHOL 286 (H) 08/28/2020   HDL 78.40 08/28/2020   LDLCALC 122 (H) 09/14/2019   LDLDIRECT 184.0 08/28/2020   TRIG 204.0 (H) 08/28/2020   CHOLHDL 4 08/28/2020   Lab Results  Component Value Date   ALT 19 08/28/2020   AST 22 08/28/2020   ALKPHOS 83 08/28/2020   BILITOT 0.5 08/28/2020      Generalized anxiety disorder Paxil 10 mg daily, discussed in January at her transition of care visit.  Working well at that time.  Still working well, requests refill of same dose. GAD 7 : Generalized Anxiety Score 08/28/2020 09/17/2019  Nervous, Anxious, on Edge 0 0  Control/stop worrying 0 0  Worry too much - different things 0 0  Trouble relaxing 0 0  Restless 0 0  Easily annoyed or irritable  - 0  Afraid - awful might happen 0 0  Total GAD 7 Score - 0      Depression screen [facility] 2/9 08/28/2020 02/18/2020 09/17/2019 10/21/2018 11/19/2017  Decreased Interest 0 0 0 0 0  Down, Depressed, Hopeless 0 0 0 0 0  PHQ - 2 Score 0 0 0 0 0  Altered sleeping - - 0 - -  Tired, decreased energy - - 0 - -  Change in appetite - - 0 - -  Feeling bad or failure about yourself  - - 0 - -  Trouble concentrating - - 0 - -  Moving slowly or fidgety/restless - - 0 - -  Suicidal thoughts - - 0 - -  PHQ-9 Score - - 0 - -   Wt Readings from Last 3 Encounters:  08/28/20 173 lb (78.5 kg)  02/18/20 175 lb 12.8 oz (79.7 kg)  09/17/19 170 lb 3.2 oz (77.2 kg)     History Patient Active Problem List   Diagnosis Date Noted   Generalized anxiety disorder 02/17/2015   Right elbow pain 08/28/2012   MUSCLE STRAIN, HAMSTRING  MUSCLE 06/06/2006   Past Medical History:  Diagnosis Date   Allergy    Claritin daily   Anxiety    Past Surgical History:  Procedure Laterality Date   Admission     Behavioral [facility] admission; 2 weeks.   MANDIBLE SURGERY  1991   No Known Allergies Prior to Admission medications   Medication Sig Start Date End Date Taking? Authorizing Provider  b complex vitamins tablet Take 1 tablet by mouth daily.   Yes [provider]  Calcium-Magnesium 100-50 MG TABS Take 1 tablet by mouth daily.   Yes [provider]  cyclobenzaprine (FLEXERIL) 5 MG tablet 1 pill by mouth up to every 8 hours as needed. Start with one pill by mouth each bedtime as needed due to sedation 02/18/20  Yes Wendie Agreste, MD  loratadine (CLARITIN) 10 MG tablet Take 10 mg by mouth daily.   Yes [provider]  Multiple Vitamin (MULTIVITAMIN) tablet Take 1 tablet by mouth daily.   Yes [provider]  PARoxetine (PAXIL) 10 MG tablet Take 1 tablet (10 mg total) by mouth every morning. 09/17/19  Yes Posey Boyer, MD  VITAMIN D, CHOLECALCIFEROL, PO Take 1,000 Units by mouth  daily.   Yes [provider]  Clindamycin Phosphate foam Apply 1 application topically 2 (two) times daily. Patient not taking: Reported on 08/28/2020 09/18/17   Forrest Moron, MD   Social History   Socioeconomic History   Marital status: Married    Spouse name: Not on file   Number of children: 0   Years of education: Not on file   Highest education level: Not on file  Occupational History   Occupation: Dance movement psychotherapist  Tobacco Use   Smoking status: Former    Years: 1.00    Types: Cigarettes    Quit date: 01/22/1983    Years since quitting: 37.6   Smokeless tobacco: Never   Tobacco comments:    high school only   Vaping Use   Vaping Use: Never used  Substance and Sexual Activity   Alcohol use: Yes    Comment: beer - 6 pack/week   Drug use: Never   Sexual activity: Yes    Birth control/protection: Pill  Other Topics Concern   Not on file  Social History Narrative   Marital status:  Married x 28 years      Children: none; one dog      Lives: with husband, dog.       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      Diet:  VEGETARIAN; Tofu and beans; smoothies with protein powder; B complex   Social Determinants of Health   Financial Resource Strain: Not on file  Food Insecurity: Not on file  Transportation Needs: Not on file  Physical Activity: Not on file  Stress: Not on file  Social Connections: Not on file  Intimate Partner Violence: Not on file    Review of Systems Per HPI.   Objective:   Vitals:   08/28/20 1322  BP: 136/74  Pulse: 88  Resp: 17  Temp: 98.3 F (36.8 C)  TempSrc: Temporal  SpO2: 97%  Weight: 173 lb (78.5 kg)  Height: '5\' 3"'$  (1.6 m)     Physical  Exam Vitals reviewed.  Constitutional:      General: She is not in acute distress.    Appearance: Normal appearance. She is  well-developed.  HENT:     Head: Normocephalic and atraumatic.  Cardiovascular:     Rate and Rhythm: Normal rate.  Pulmonary:     Effort: Pulmonary effort is normal.  Musculoskeletal:     Comments: Bilateral hands, skin intact, no apparent soft tissue swelling, erythema or rash.  No focal joint swelling.  Discomfort at [facility] with full flexion, but equal flexion without significant limitation.  She does have some intermittent triggering of the middle phalanges bilaterally.  Neurovascular intact distally.  Neurological:     Mental Status: She is alert and oriented to person, place, and time.  Psychiatric:        Mood and Affect: Mood normal.        Behavior: Behavior normal.        Thought Content: Thought content normal.        Judgment: Judgment normal.       Assessment & Plan:  NIAOMI HODGKINSON is a 55 y.o. female . 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 Will check sed rate, Rheum factor, but less likely inflammatory arthritis.  Check imaging, initial trial of Voltaren Gel or meloxicam if needed.  Possible side effects and risks with other medicines discussed. - -Also some component of trigger finger likely in middle phalanges.  Initial symptomatic care planned as above, consider hand Ortho eval.  Hyperlipidemia, unspecified hyperlipidemia type - Plan: Comprehensive metabolic panel, Lipid panel  -Diet/exercise approach.  Recheck labs.  Generalized anxiety disorder - Plan: PARoxetine (PAXIL) 10 MG tablet  -Stable on current regimen, continue same  Meds ordered this encounter  Medications   meloxicam (MOBIC) 7.5 MG tablet    Sig: Take 1 tablet (7.5 mg total) by mouth daily.    Dispense:  30 tablet    Refill:  0    PARoxetine (PAXIL) 10 MG tablet    Sig: Take 1 tablet (10 mg total) by mouth every morning.    Dispense:  90 tablet    Refill:  3   Patient Instructions  Hand pain may be related to arthritis.  Try over-the-counter Voltaren gel 3 times per day.  If that is not helpful then can switch to meloxicam once per day.  Watch for any new stomach upset or bleeding, stop right away if that occurs.  I will check some rheumatoid test but less likely cause.  See information below on trigger finger.  If not improving would recommend evaluation with hand specialist.  Return to the clinic or go to the nearest emergency room if any of your symptoms worsen or new symptoms occur.  Trigger Finger  Trigger finger, also called stenosing tenosynovitis,  is a condition that causes a finger to get stuck in a bent position. Each finger has a tendon, which is a tough, cord-like tissue that connects muscle tobone, and each tendon passes through a tunnel of tissue called a tendon sheath. To move your finger, your tendon needs to glide freely through the sheath. Trigger finger happens when the tendon or the sheath thickens, making it difficult to move your finger. Trigger finger can affect any finger or a thumb. It  may affect more than one finger. Mild cases may clear up with rest andmedicine. Severe cases require more treatment. What are the causes? Trigger finger is caused by a thickened finger tendon or tendon sheath. Thecause of this thickening is not known. What increases the risk? The following factors may make you more likely to develop this condition: Doing activities that require a strong grip. Having rheumatoid arthritis, gout, or diabetes. Being 27-85 years old. Being female. What are the signs or symptoms? Symptoms of this condition include: Pain when bending or straightening your finger. Tenderness or swelling where your finger attaches to the palm of your hand. A lump in the palm of your hand or on the  inside of your finger. Hearing a noise like a pop or a snap when you try to straighten your finger. Feeling a catching or locking sensation when you try to straighten your finger. Being unable to straighten your finger. How is this diagnosed? This condition is diagnosed based on your symptoms and a physical exam. How is this treated? This condition may be treated by: Resting your finger and avoiding activities that make symptoms worse. Wearing a finger splint to keep your finger extended. Taking NSAIDs, such as ibuprofen, to relieve pain and swelling. Doing gentle exercises to stretch the finger as told by your health care provider. Having medicine that reduces swelling and inflammation (steroids) injected into the tendon sheath. Injections may need to be repeated. Having surgery to open the tendon sheath. This may be done if other treatments do not work and you cannot straighten your finger. You may need physical therapy after surgery. Follow these instructions at home: If you have a splint: Wear the splint as told by your health care provider. Remove it only as told by your health care provider. Loosen it if your fingers tingle, become numb, or turn cold and blue. Keep it clean. If the splint is not waterproof: Do not let it get wet. Cover it with a watertight covering when you take a bath or shower. Managing pain, stiffness, and swelling     If directed, apply heat to the affected area as often as told by your health care provider. Use the heat source that your health care provider recommends, such as a moist heat pack or a heating pad. Place a towel between your skin and the heat source. Leave the heat on for 20-30 minutes. Remove the heat if your skin turns bright red. This is especially important if you are unable to feel pain, heat, or cold. You may have a greater risk of getting burned. If directed, put ice on the painful area. To do this: If you have a removable splint, remove  it as told by your health care provider. Put ice in a plastic bag. Place a towel between your skin and the bag or between your splint and the bag. Leave the ice on for 20 minutes, 2-3 times a day.  Activity Rest your finger as told by your health care provider. Avoid activities that make the pain worse. Return to your normal activities as told by your health care provider. Ask your health care provider what activities are safe for you. Do exercises as told by your health care provider. Ask your health care provider when it is safe to drive if you have a splint on your hand. General instructions Take over-the-counter and prescription medicines only as told by your health care provider. Keep all follow-up visits as told by your health care provider. This  is important. Contact a health care provider if: Your symptoms are not improving with home care. Summary Trigger finger, also called stenosing tenosynovitis, causes your finger to get stuck in a bent position. This can make it difficult and painful to straighten your finger. This condition develops when a finger tendon or tendon sheath thickens. Treatment may include resting your finger, wearing a splint, and taking medicines. In severe cases, surgery to open the tendon sheath may be needed. This information is not intended to replace advice given to you by your health care provider. Make sure you discuss any questions you have with your healthcare provider. Document Revised: 05/25/2018 Document Reviewed: 05/25/2018 Elsevier Patient Education  2022 Maish Vaya,   Merri Ray, MD [place] 08/28/20 11:10 PM

## 2020-08-28 NOTE — Patient Instructions (Addendum)
Hand pain may be related to arthritis.  Try over-the-counter Voltaren gel 3 times per day.  If that is not helpful then can switch to meloxicam once per day.  Watch for any new stomach upset or bleeding, stop right away if that occurs.  I will check some rheumatoid test but less likely cause.  See information below on trigger finger.  If not improving would recommend evaluation with hand specialist.  Return to the clinic or go to the nearest emergency room if any of your symptoms worsen or new symptoms occur.  Trigger Finger  Trigger finger, also called stenosing tenosynovitis,  is a condition that causes a finger to get stuck in a bent position. Each finger has a tendon, which is a tough, cord-like tissue that connects muscle tobone, and each tendon passes through a tunnel of tissue called a tendon sheath. To move your finger, your tendon needs to glide freely through the sheath. Trigger finger happens when the tendon or the sheath thickens, making it difficult to move your finger. Trigger finger can affect any finger or a thumb. It may affect more than one finger. Mild cases may clear up with rest andmedicine. Severe cases require more treatment. What are the causes? Trigger finger is caused by a thickened finger tendon or tendon sheath. Thecause of this thickening is not known. What increases the risk? The following factors may make you more likely to develop this condition: Doing activities that require a strong grip. Having rheumatoid arthritis, gout, or diabetes. Being 38-52 years old. Being female. What are the signs or symptoms? Symptoms of this condition include: Pain when bending or straightening your finger. Tenderness or swelling where your finger attaches to the palm of your hand. A lump in the palm of your hand or on the inside of your finger. Hearing a noise like a pop or a snap when you try to straighten your finger. Feeling a catching or locking sensation when you try to  straighten your finger. Being unable to straighten your finger. How is this diagnosed? This condition is diagnosed based on your symptoms and a physical exam. How is this treated? This condition may be treated by: Resting your finger and avoiding activities that make symptoms worse. Wearing a finger splint to keep your finger extended. Taking NSAIDs, such as ibuprofen, to relieve pain and swelling. Doing gentle exercises to stretch the finger as told by your health care provider. Having medicine that reduces swelling and inflammation (steroids) injected into the tendon sheath. Injections may need to be repeated. Having surgery to open the tendon sheath. This may be done if other treatments do not work and you cannot straighten your finger. You may need physical therapy after surgery. Follow these instructions at home: If you have a splint: Wear the splint as told by your health care provider. Remove it only as told by your health care provider. Loosen it if your fingers tingle, become numb, or turn cold and blue. Keep it clean. If the splint is not waterproof: Do not let it get wet. Cover it with a watertight covering when you take a bath or shower. Managing pain, stiffness, and swelling     If directed, apply heat to the affected area as often as told by your health care provider. Use the heat source that your health care provider recommends, such as a moist heat pack or a heating pad. Place a towel between your skin and the heat source. Leave the heat on for 20-30 minutes. Remove  the heat if your skin turns bright red. This is especially important if you are unable to feel pain, heat, or cold. You may have a greater risk of getting burned. If directed, put ice on the painful area. To do this: If you have a removable splint, remove it as told by your health care provider. Put ice in a plastic bag. Place a towel between your skin and the bag or between your splint and the bag. Leave  the ice on for 20 minutes, 2-3 times a day.  Activity Rest your finger as told by your health care provider. Avoid activities that make the pain worse. Return to your normal activities as told by your health care provider. Ask your health care provider what activities are safe for you. Do exercises as told by your health care provider. Ask your health care provider when it is safe to drive if you have a splint on your hand. General instructions Take over-the-counter and prescription medicines only as told by your health care provider. Keep all follow-up visits as told by your health care provider. This is important. Contact a health care provider if: Your symptoms are not improving with home care. Summary Trigger finger, also called stenosing tenosynovitis, causes your finger to get stuck in a bent position. This can make it difficult and painful to straighten your finger. This condition develops when a finger tendon or tendon sheath thickens. Treatment may include resting your finger, wearing a splint, and taking medicines. In severe cases, surgery to open the tendon sheath may be needed. This information is not intended to replace advice given to you by your health care provider. Make sure you discuss any questions you have with your healthcare provider. Document Revised: 05/25/2018 Document Reviewed: 05/25/2018 Elsevier Patient Education  [place].

## 2020-08-29 LAB — RHEUMATOID FACTOR: Rheumatoid fact SerPl-aCnc: <14 IU/mL (ref <14)

## 2020-08-30 ENCOUNTER — Encounter: Payer: Self-pay | Admitting: Family Medicine

## 2020-08-30 NOTE — Telephone Encounter (Signed)
Pt concerned about lab work from 08/28/2020 pt requesting information on what an elevated sed rate cant imply.   Pt can accept results via MyChart.

## 2020-09-03 ENCOUNTER — Other Ambulatory Visit: Payer: Self-pay | Admitting: Family Medicine

## 2020-09-03 DIAGNOSIS — R700 Elevated erythrocyte sedimentation rate: Secondary | ICD-10-CM

## 2020-09-03 DIAGNOSIS — M79641 Pain in right hand: Secondary | ICD-10-CM

## 2020-09-03 DIAGNOSIS — M79642 Pain in left hand: Secondary | ICD-10-CM

## 2020-09-03 NOTE — Telephone Encounter (Signed)
Replied by [employer] on labs.

## 2020-09-03 NOTE — Progress Notes (Signed)
See lab note.  

## 2020-09-28 ENCOUNTER — Other Ambulatory Visit: Payer: Self-pay

## 2020-09-28 ENCOUNTER — Ambulatory Visit (INDEPENDENT_AMBULATORY_CARE_PROVIDER_SITE_OTHER): Payer: BC Managed Care – PPO | Admitting: Gastroenterology

## 2020-09-28 ENCOUNTER — Encounter: Payer: Self-pay | Admitting: Gastroenterology

## 2020-09-28 DIAGNOSIS — K640 First degree hemorrhoids: Secondary | ICD-10-CM | POA: Diagnosis not present

## 2020-09-28 NOTE — Patient Instructions (Signed)

## 2020-09-28 NOTE — Progress Notes (Signed)
Chief Complaint:    Symptomatic Internal Hemorrhoids; Hemorrhoid Band Ligation  GI History: 55 year old female with symptomatic internal hemorrhoids, with index symptoms of intermittent scant BRBPR, rectal itching, and irritation.  No relief with Preparation H.  Some improvement with Sitz bath.  Previously used suppositories with only minimal improvement.  Otherwise, no constipation or diarrhea.     - 07/02/2018: Banding of the LL hemorrhoid - 08/13/2018: Banding of the RA hemorrhoid - 10/06/2018: Banding of the RP hemorrhoid   Had done very well with hemorrhoid banding, with resolution of hemorrhoidal symptoms.  More recently, started developing hemorrhoidal symptoms again (rectal itching, irritation; no bleeding), and presents today for evaluation and possible repeat hemorrhoid banding.  Otherwise, no significant change in medical or surgical history, medications, allergies, social history since last appointment with me.   Endoscopic history: - Colonoscopy (05/2018, Dr. Bryan Lemma, routine CRC screening): 3 tubular adenomas, one sessile serrated polyp, all <10 mm; normal TI, internal hemorrhoids   Review of systems:     No chest pain, no SOB, no fevers, no urinary sx   Past Medical History:  Diagnosis Date   Allergy    Claritin daily   Anxiety     Patient's surgical history, family medical history, social history, medications and allergies were all reviewed in Epic    Current Outpatient Medications  Medication Sig Dispense Refill   b complex vitamins tablet Take 1 tablet by mouth daily.     Calcium-Magnesium 100-50 MG TABS Take 1 tablet by mouth daily.     loratadine (CLARITIN) 10 MG tablet Take 10 mg by mouth daily.     Multiple Vitamin (MULTIVITAMIN) tablet Take 1 tablet by mouth daily.     PARoxetine (PAXIL) 10 MG tablet Take 1 tablet (10 mg total) by mouth every morning. 90 tablet 3   VITAMIN D, CHOLECALCIFEROL, PO Take 1,000 Units by mouth daily.     No current  facility-administered medications for this visit.    Physical Exam:     BP 126/78   Pulse 65   Ht '5\' 3"'$  (1.6 m)   Wt 170 lb (77.1 kg)   SpO2 98%   BMI 30.11 kg/m   GENERAL:  Pleasant female in NAD PSYCH: : Cooperative, normal affect Rectal exam: Sensation intact and preserved anal wink.  Grade 1 hemorrhoids noted in RA and LL positions on anoscopy.  No external anal fissures noted. Normal sphincter tone. No palpable mass. No blood on the exam glove. (Chaperone: Othelia Pulling, CMA).   IMPRESSION and PLAN:    #1.  Symptomatic internal hemorrhoids: PROCEDURE NOTE: The patient presents with symptomatic grade 1 hemorrhoids, unresponsive to maximal medical therapy, requesting rubber band ligation of symptomatic hemorrhoidal disease.  All risks, benefits and alternative forms of therapy were described and informed consent was obtained.  In the Left Lateral Decubitus position, anoscopic examination revealed grade 1 hemorrhoids in the RA and LL position(s).  The anorectum was pre-medicated with RectiCare. The decision was made to band the both the RA and LL internal hemorrhoid columns, and the Privateer was used to perform band ligation without complication.  Digital anorectal examination was then performed to assure proper positioning of the band, and to adjust the banded tissue as required.  The patient was discharged home without pain or other issues.  Dietary and behavioral recommendations were given and along with follow-up instructions.     The following adjunctive treatments were recommended:  -Resume high-fiber diet with fiber supplement (i.e. Citrucel or Benefiber)  with goal for soft stools without straining to have a BM. -Resume adequate fluid intake.  The patient will return as needed for  follow-up and possible additional banding as required. No complications were encountered and the patient tolerated the procedure well.      #2.  History of Tubular Adenomas and  sessile serrated polyp: - Repeat colonoscopy in 2023 for ongoing polyp surveillance.      [place] ,DO, FACG 09/28/2020, 9:01 AM

## 2020-10-05 ENCOUNTER — Ambulatory Visit: Payer: BC Managed Care – PPO | Admitting: Family Medicine

## 2021-02-20 LAB — HM PAP SMEAR

## 2021-07-16 ENCOUNTER — Encounter: Payer: Self-pay | Admitting: Gastroenterology

## 2021-08-29 ENCOUNTER — Telehealth: Payer: Self-pay | Admitting: Family Medicine

## 2021-09-03 NOTE — Telephone Encounter (Signed)
error 

## 2021-09-21 ENCOUNTER — Ambulatory Visit: Payer: BC Managed Care – PPO | Admitting: Family Medicine

## 2021-09-21 ENCOUNTER — Encounter: Payer: Self-pay | Admitting: Family Medicine

## 2021-09-21 VITALS — BP 124/72 | HR 59 | Temp 97.9°F | Ht 63.0 in | Wt 169.8 lb

## 2021-09-21 DIAGNOSIS — Z23 Encounter for immunization: Secondary | ICD-10-CM

## 2021-09-21 DIAGNOSIS — F411 Generalized anxiety disorder: Secondary | ICD-10-CM

## 2021-09-21 DIAGNOSIS — Z1159 Encounter for screening for other viral diseases: Secondary | ICD-10-CM | POA: Diagnosis not present

## 2021-09-21 DIAGNOSIS — E785 Hyperlipidemia, unspecified: Secondary | ICD-10-CM

## 2021-09-21 LAB — LIPID PANEL
Cholesterol: 260 mg/dL — ABNORMAL HIGH (ref 0–200)
HDL: 66.5 mg/dL (ref >39.00)
NonHDL: 193.64
Total CHOL/HDL Ratio: 4
Triglycerides: 220 mg/dL — ABNORMAL HIGH (ref 0.0–149.0)
VLDL: 44 mg/dL — ABNORMAL HIGH (ref 0.0–40.0)

## 2021-09-21 LAB — COMPREHENSIVE METABOLIC PANEL
ALT: 17 U/L (ref 0–35)
AST: 23 U/L (ref 0–37)
Albumin: 4.4 g/dL (ref 3.5–5.2)
Alkaline Phosphatase: 56 U/L (ref 39–117)
BUN: 7 mg/dL (ref 6–23)
CO2: 29 mEq/L (ref 19–32)
Calcium: 9.5 mg/dL (ref 8.4–10.5)
Chloride: 98 mEq/L (ref 96–112)
Creatinine, Ser: 0.76 mg/dL (ref 0.40–1.20)
GFR: 88.88 mL/min (ref >60.00)
Glucose, Bld: 80 mg/dL (ref 70–99)
Potassium: 4 mEq/L (ref 3.5–5.1)
Sodium: 134 mEq/L — ABNORMAL LOW (ref 135–145)
Total Bilirubin: 0.7 mg/dL (ref 0.2–1.2)
Total Protein: 7.4 g/dL (ref 6.0–8.3)

## 2021-09-21 LAB — LDL CHOLESTEROL, DIRECT: Direct LDL: 154 mg/dL

## 2021-09-21 NOTE — Progress Notes (Unsigned)
Subjective:  Patient ID: Lori Wheeler, female    DOB: [birth year]  Age: 56 y.o. MRN: [id number]  CC:  Chief Complaint  Patient presents with   Anxiety    Pt states all is well    HPI AZALYNN MAXIM presents for   Generalized anxiety disorder: Paxil '10mg'$  qd. Working well.  On HRT from GYN - helped prior hand pains.      09/21/2021    8:24 AM 08/28/2020    1:25 PM 02/18/2020    1:58 PM 09/17/2019    1:20 PM 10/21/2018   10:44 AM  Depression screen PHQ 2/9  Decreased Interest 0 0 0 0 0  Down, Depressed, Hopeless 0 0 0 0 0  PHQ - 2 Score 0 0 0 0 0  Altered sleeping 0   0   Tired, decreased energy 0   0   Change in appetite 0   0   Feeling bad or failure about yourself  0   0   Trouble concentrating 0   0   Moving slowly or fidgety/restless 0   0   Suicidal thoughts 0   0   PHQ-9 Score 0   0       08/28/2020    1:28 PM 09/17/2019    1:21 PM  GAD 7 : Generalized Anxiety Score  Nervous, Anxious, on Edge 0 0  Control/stop worrying 0 0  Worry too much - different things 0 0  Trouble relaxing 0 0  Restless 0 0  Easily annoyed or irritable  0  Afraid - awful might happen 0 0  Total GAD 7 Score  0   HM: Flu vaccine today Covid vaccine - initial vaccine, booster. Disease x2, declines bivalent booster.  Mammogram in next few months. Repeat colonoscopy this year.   Hyperlipidemia: Fasting today. No statin/meds.  The 10-year ASCVD risk score (Arnett DK, et al., 2019) is: 1.8%   Values used to calculate the score:     Age: 89 years     Sex: Female     Is Non-Hispanic African American: No     Diabetic: No     Tobacco smoker: No     Systolic Blood Pressure: 619 mmHg     Is BP treated: No     HDL Cholesterol: 78.4 mg/dL     Total Cholesterol: 286 mg/dL  Lab Results  Component Value Date   CHOL 286 (H) 08/28/2020   HDL 78.40 08/28/2020   LDLCALC 122 (H) 09/14/2019   LDLDIRECT 184.0 08/28/2020   TRIG 204.0 (H) 08/28/2020   CHOLHDL 4 08/28/2020   Lab Results   Component Value Date   ALT 19 08/28/2020   AST 22 08/28/2020   ALKPHOS 83 08/28/2020   BILITOT 0.5 08/28/2020     History Patient Active Problem List   Diagnosis Date Noted   Generalized anxiety disorder 02/17/2015   Right elbow pain 08/28/2012   MUSCLE STRAIN, HAMSTRING MUSCLE 06/06/2006   Past Medical History:  Diagnosis Date   Allergy    Claritin daily   Anxiety    Past Surgical History:  Procedure Laterality Date   Admission     [facility] admission; 2 weeks.   MANDIBLE SURGERY  1991   No Known Allergies Prior to Admission medications   Medication Sig Start Date End Date Taking? Authorizing Provider  b complex vitamins tablet Take 1 tablet by mouth daily.   Yes [provider]  Calcium-Magnesium 100-50 MG TABS Take 1  tablet by mouth daily.   Yes [provider]  estradiol (ESTRACE) 1 MG tablet Take 1 mg by mouth daily.   Yes [provider]  loratadine (CLARITIN) 10 MG tablet Take 10 mg by mouth daily.   Yes [provider]  Multiple Vitamin (MULTIVITAMIN) tablet Take 1 tablet by mouth daily.   Yes [provider]  PARoxetine (PAXIL) 10 MG tablet Take 1 tablet (10 mg total) by mouth every morning. 08/28/20  Yes Wendie Agreste, MD  progesterone (PROMETRIUM) 100 MG capsule Take 100 mg by mouth daily.   Yes [provider]  VITAMIN D, CHOLECALCIFEROL, PO Take 1,000 Units by mouth daily.   Yes [provider]   Social History   Socioeconomic History   Marital status: Married    Spouse name: Not on file   Number of children: 0   Years of education: Not on file   Highest education level: Not on file  Occupational History   Occupation: Dance movement psychotherapist  Tobacco Use   Smoking status: Former    Years: 1.00    Types: Cigarettes    Quit date: 01/22/1983    Years since quitting: 38.6   Smokeless tobacco: Never   Tobacco comments:    high school only   Vaping Use   Vaping Use: Never used   Substance and Sexual Activity   Alcohol use: Yes    Comment: beer - 6 pack/week   Drug use: Never   Sexual activity: Yes    Birth control/protection: Pill  Other Topics Concern   Not on file  Social History Narrative   Marital status:  Married x 28 years      Children: none; one dog      Lives: with husband, dog.       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      Diet:  VEGETARIAN; Tofu and beans; smoothies with protein powder; B complex   Social Determinants of Health   Financial Resource Strain: Not on file  Food Insecurity: Not on file  Transportation Needs: Not on file  Physical Activity: Not on file  Stress: Not on file  Social Connections: Not on file  Intimate Partner Violence: Not on file    Review of Systems   Objective:   Vitals:   09/21/21 0828  BP: 124/72  Pulse: (!) 59  Temp: 97.9 F (36.6 C)  SpO2: 99%  Weight: 169 lb 12.8 oz (77 kg)  Height: '5\' 3"'$  (1.6 m)     Physical Exam Vitals reviewed.  Constitutional:      Appearance: Normal appearance. She is well-developed.  HENT:     Head: Normocephalic and atraumatic.  Eyes:     Conjunctiva/sclera: Conjunctivae normal.     Pupils: Pupils are equal, round, and reactive to light.  Neck:     Vascular: No carotid bruit.  Cardiovascular:     Rate and Rhythm: Normal rate and regular rhythm.     Heart sounds: Normal heart sounds.  Pulmonary:     Effort: Pulmonary effort is normal.     Breath sounds: Normal breath sounds.  Abdominal:     Palpations: Abdomen is soft. There is no pulsatile mass.     Tenderness: There is no  abdominal tenderness.  Musculoskeletal:     Right lower leg: No edema.     Left lower leg: No edema.  Skin:    General: Skin is warm and dry.  Neurological:     Mental Status: She is alert and oriented to person, place, and time.   Psychiatric:        Mood and Affect: Mood normal.        Behavior: Behavior normal.        Thought Content: Thought content normal.        Assessment & Plan:  BERNEICE ZETTLEMOYER is a 56 y.o. female . No diagnosis found.   No orders of the defined types were placed in this encounter.  There are no Patient Instructions on file for this visit.    Signed,   Merri Ray, MD [facility] 09/21/21 8:47 AM

## 2021-09-21 NOTE — Patient Instructions (Signed)
No med changes for now. Thanks for coming in today. If any concerns on labs I will let you know.

## 2021-09-23 ENCOUNTER — Encounter: Payer: Self-pay | Admitting: Family Medicine

## 2021-09-24 LAB — HEPATITIS C ANTIBODY: Hepatitis C Ab: NONREACTIVE

## 2021-10-06 ENCOUNTER — Other Ambulatory Visit: Payer: Self-pay | Admitting: Family Medicine

## 2021-10-06 DIAGNOSIS — F411 Generalized anxiety disorder: Secondary | ICD-10-CM

## 2022-02-01 ENCOUNTER — Ambulatory Visit: Payer: BC Managed Care – PPO | Admitting: Gastroenterology

## 2022-02-20 ENCOUNTER — Encounter: Payer: Self-pay | Admitting: Gastroenterology

## 2022-02-20 ENCOUNTER — Ambulatory Visit: Payer: BC Managed Care – PPO | Admitting: Gastroenterology

## 2022-02-20 VITALS — BP 124/70 | HR 74 | Ht 63.0 in | Wt 169.0 lb

## 2022-02-20 DIAGNOSIS — K640 First degree hemorrhoids: Secondary | ICD-10-CM

## 2022-02-20 DIAGNOSIS — Z86010 Personal history of colonic polyps: Secondary | ICD-10-CM

## 2022-02-20 MED ORDER — NA SULFATE-K SULFATE-MG SULF 17.5-3.13-1.6 GM/177ML PO SOLN: 1.0000 | Freq: Once | ORAL | 0 refills | Status: AC

## 2022-02-20 NOTE — Patient Instructions (Signed)
You have been scheduled for a colonoscopy. Please follow written instructions given to you at your visit today.  Please pick up your prep supplies at the pharmacy within the next 1-3 days. If you use inhalers (even only as needed), please bring them with you on the day of your procedure.   _______________________________________________________  If your blood pressure at your visit was 140/90 or greater, please contact your primary care physician to follow up on this.  _______________________________________________________  If you are age 5 or younger, your body mass index should be between 19-25. Your Body mass index is 29.94 kg/m. If this is out of the aformentioned range listed, please consider follow up with your Primary Care Provider.   __________________________________________________________  The [facility] providers would like to encourage you to use [facility] to communicate with providers for non-urgent requests or questions.  Due to long hold times on the telephone, sending your provider a message by [facility] may be a faster and more efficient way to get a response.  Please allow 48 business hours for a response.  Please remember that this is for non-urgent requests.    Due to recent changes in healthcare laws, you may see the results of your imaging and laboratory studies on MyChart before your provider has had a chance to review them.  We understand that in some cases there may be results that are confusing or concerning to you. Not all laboratory results come back in the same time frame and the provider may be waiting for multiple results in order to interpret others.  Please give [place] 48 hours in order for your provider to thoroughly review all the results before contacting the office for clarification of your results.    Thank you for choosing me and [facility].  Vito Cirigliano, D.O.

## 2022-02-20 NOTE — Progress Notes (Signed)
Chief Complaint:    Colon polyp surveillance, hemorrhoids  GI History: 57 year old female with symptomatic internal hemorrhoids, with index symptoms of intermittent scant BRBPR, rectal itching, and irritation.  No relief with Preparation H.  Some improvement with Sitz bath.  Previously used suppositories with only minimal improvement.  Otherwise, no constipation or diarrhea.     - 07/02/2018: Banding of the LL hemorrhoid - 08/13/2018: Banding of the RA hemorrhoid - 10/06/2018: Banding of the RP hemorrhoid - 09/28/2020: Recurrence of hemorrhoidal symptoms (rectal irritation, pruritus ani; no bleeding).  Banding of grade 1 RA and LL hemorrhoid columns    Endoscopic history: - Colonoscopy (05/2018, Dr. Bryan Lemma, routine CRC screening): 3 tubular adenomas, one sessile serrated polyp, all <10 mm; normal TI, internal hemorrhoids.  Repeat in 3 years    HPI:     Patient is a 57 y.o. female presenting to the Gastroenterology Clinic for follow-up.   She was having symptomatic hemorrhoids earlier this month described as soreness, itching, and irritation, but no bleeding.  Was in the setting of dietary indiscretions after death of her mother. Symptoms have since regressed with dietary changes.   Mother died in Dec 09, 2022 with malignant bowel obstruction. Unsure of details, but knows it was not colon cancer.       Latest Ref Rng & Units 09/21/2021    9:16 AM 08/28/2020    2:09 PM 09/14/2019   12:13 PM  CMP  Glucose 70 - 99 mg/dL 80  75  95   BUN 6 - 23 mg/dL '7  9  8   '$ Creatinine 0.40 - 1.20 mg/dL 0.76  0.71  0.93   Sodium 135 - 145 mEq/L 134  137  140   Potassium 3.5 - 5.1 mEq/L 4.0  3.8  4.6   Chloride 96 - 112 mEq/L 98  99  105   CO2 19 - 32 mEq/L '29  26  21   '$ Calcium 8.4 - 10.5 mg/dL 9.5  10.2  9.3   Total Protein 6.0 - 8.3 g/dL 7.4  7.6  7.1   Total Bilirubin 0.2 - 1.2 mg/dL 0.7  0.5  0.3   Alkaline Phos 39 - 117 U/L 56  83  51   AST 0 - 37 U/L '23  22  20   '$ ALT 0 - 35 U/L '17  19  17       '$ Review of systems:     No chest pain, no SOB, no fevers, no urinary sx   Past Medical History:  Diagnosis Date   Allergy    Claritin daily   Anxiety     Patient's surgical history, family medical history, social history, medications and allergies were all reviewed in Epic    Current Outpatient Medications  Medication Sig Dispense Refill   b complex vitamins tablet Take 1 tablet by mouth daily.     Calcium-Magnesium 100-50 MG TABS Take 1 tablet by mouth daily.     estradiol (ESTRACE) 1 MG tablet Take 1 mg by mouth daily.     loratadine (CLARITIN) 10 MG tablet Take 10 mg by mouth daily.     Multiple Vitamin (MULTIVITAMIN) tablet Take 1 tablet by mouth daily.     PARoxetine (PAXIL) 10 MG tablet TAKE 1 TABLET (10 MG TOTAL) BY MOUTH EVERY MORNING. 90 tablet 3   progesterone (PROMETRIUM) 100 MG capsule Take 100 mg by mouth daily.     VITAMIN D, CHOLECALCIFEROL, PO Take 1,000 Units by mouth daily.     No  current facility-administered medications for this visit.    Physical Exam:     There were no vitals taken for this visit.  GENERAL:  Pleasant female in NAD PSYCH: : Cooperative, normal affect ABDOMEN:  Nondistended, soft, nontender.  SKIN:  turgor, no lesions seen Musculoskeletal:  Normal muscle tone, normal strength NEURO: Alert and oriented x 3, no focal neurologic deficits Rectal exam: Sensation intact and preserved anal wink. No external anal fissures, external hemorrhoids or skin tags. Normal sphincter tone. No palpable mass. No blood on the exam glove.  Anoscopy with single small grade 1 internal hemorrhoid in the RP position.  (Chaperone: Renee Rival, CMA).     IMPRESSION and PLAN:    1) Internal Hemorrhoids Has had overall good response to hemorrhoid banding series with recent mild breakthrough symptoms.  Symptoms improving with dietary modification alone and without need for topical therapy.  Exam today with single column grade 1 internal hemorrhoids and RP  position. - Continue conservative management for now - If return of hemorrhoidal symptoms, discussed plan for repeat hemorrhoid banding  2) History of Tubular Adenomas and sessile serrated polyp: - Overdue for repeat colonoscopy (was due in 2023) for ongoing polyp surveillance - Schedule colonoscopy  The indications, risks, and benefits of colonoscopy were explained to the patient in detail. Risks include but are not limited to bleeding, perforation, adverse reaction to medications, and cardiopulmonary compromise. Sequelae include but are not limited to the possibility of surgery, hospitalization, and mortality. The patient verbalized understanding and wished to proceed. All questions answered, referred to the scheduler and bowel prep ordered. Further recommendations pending results of the exam.            [name] ,DO, FACG 02/20/2022, 8:46 AM

## 2022-04-02 ENCOUNTER — Encounter: Payer: Self-pay | Admitting: Gastroenterology

## 2022-04-05 ENCOUNTER — Encounter: Payer: Self-pay | Admitting: Family Medicine

## 2022-04-05 ENCOUNTER — Ambulatory Visit: Payer: BC Managed Care – PPO | Admitting: Family Medicine

## 2022-04-05 VITALS — BP 126/84 | HR 71 | Temp 97.9°F | Resp 16 | Ht 63.0 in | Wt 171.5 lb

## 2022-04-05 DIAGNOSIS — R070 Pain in throat: Secondary | ICD-10-CM | POA: Diagnosis not present

## 2022-04-05 MED ORDER — LIDOCAINE VISCOUS HCL 2 % MT SOLN: OROMUCOSAL | 0 refills | Status: AC

## 2022-04-05 NOTE — Patient Instructions (Signed)
Follow up as needed or as scheduled USE the lidocaine to swish and swallow If no improvement in pain or symptoms change or worsen, please let us know!  We can always switch gears Call with any questions or concerns Hang in there!!!

## 2022-04-05 NOTE — Progress Notes (Signed)
   Subjective:    Patient ID: Lori Wheeler, female    DOB: 12-09-1965, 57 y.o.   MRN: [id number]  HPI Sore throat- pt felt rice go down the wrong way on Wednesday.  Was able to cough it up and now has the feeling of 'needles' in her throat.  Pain gets worse w/ talking.  Pain is R sided.  No fever.  No wet or productive coughs.  Not currently on acid reducer.  Pain level does not change w/ swallowing.   Review of Systems For ROS see HPI     Objective:   Physical Exam Vitals reviewed.  Constitutional:      General: She is not in acute distress.    Appearance: She is well-developed. She is not ill-appearing.  HENT:     Head: Normocephalic and atraumatic.     Nose: No congestion or rhinorrhea.     Mouth/Throat:     Mouth: Mucous membranes are moist. No oral lesions.     Pharynx: No pharyngeal swelling, oropharyngeal exudate, posterior oropharyngeal erythema or uvula swelling.     Tonsils: No tonsillar exudate or tonsillar abscesses.     Comments: Mild PND Eyes:     Conjunctiva/sclera: Conjunctivae normal.  Musculoskeletal:     Cervical back: Normal range of motion and neck supple.  Lymphadenopathy:     Cervical: No cervical adenopathy.  Skin:    General: Skin is warm and dry.  Neurological:     Mental Status: She is alert.           Assessment & Plan:  Throat pain- new.  Pt reports she had some rice 'go down the wrong way' 2 nights ago but was able to forcefully cough it out.  Since then, she has had very localized R sided throat pain and describes this as 'needles'.  PE is WNL.  Discussed that sometimes a forceful cough can cause microtears in the lining.  She denies any systemic sxs that would suggest aspiration.  Use viscous lidocaine for pain relief and she is to let us know if sxs don't improve.  Pt expressed understanding and is in agreement w/ plan.

## 2022-04-08 ENCOUNTER — Encounter: Payer: Self-pay | Admitting: Gastroenterology

## 2022-04-10 ENCOUNTER — Encounter: Payer: BC Managed Care – PPO | Admitting: Gastroenterology

## 2022-04-16 ENCOUNTER — Ambulatory Visit (AMBULATORY_SURGERY_CENTER): Payer: BC Managed Care – PPO | Admitting: *Deleted

## 2022-04-16 ENCOUNTER — Encounter: Payer: Self-pay | Admitting: Gastroenterology

## 2022-04-16 VITALS — Ht 63.0 in | Wt 168.0 lb

## 2022-04-16 DIAGNOSIS — Z86010 Personal history of colonic polyps: Secondary | ICD-10-CM

## 2022-04-16 NOTE — Progress Notes (Signed)
No egg or soy allergy known to patient  No issues known to pt with past sedation with any surgeries or procedures Patient denies ever being told they had issues or difficulty with intubation  No FH of Malignant Hyperthermia Pt is not on diet pills Pt is not on  home 02  Pt is not on blood thinners  Pt denies issues with constipation  No A fib or A flutter Have any cardiac testing pending--no Pt instructed to use Singlecare.com or GoodRx for a price reduction on prep    Pt.already has suprep at home  Patient's chart reviewed by Osvaldo Angst CNRA prior to previsit and patient appropriate for the [facility].  Previsit completed and red dot placed by patient's name on their procedure day (on provider's schedule).

## 2022-04-25 ENCOUNTER — Encounter: Payer: Self-pay | Admitting: Certified Registered Nurse Anesthetist

## 2022-04-26 ENCOUNTER — Ambulatory Visit (AMBULATORY_SURGERY_CENTER): Payer: BC Managed Care – PPO | Admitting: Gastroenterology

## 2022-04-26 ENCOUNTER — Encounter: Payer: Self-pay | Admitting: Gastroenterology

## 2022-04-26 VITALS — BP 96/69 | HR 60 | Temp 97.5°F | Resp 12 | Ht 63.0 in | Wt 168.0 lb

## 2022-04-26 DIAGNOSIS — D120 Benign neoplasm of cecum: Secondary | ICD-10-CM | POA: Diagnosis not present

## 2022-04-26 DIAGNOSIS — D125 Benign neoplasm of sigmoid colon: Secondary | ICD-10-CM | POA: Diagnosis not present

## 2022-04-26 DIAGNOSIS — K635 Polyp of colon: Secondary | ICD-10-CM

## 2022-04-26 DIAGNOSIS — K640 First degree hemorrhoids: Secondary | ICD-10-CM

## 2022-04-26 DIAGNOSIS — Z09 Encounter for follow-up examination after completed treatment for conditions other than malignant neoplasm: Secondary | ICD-10-CM | POA: Diagnosis present

## 2022-04-26 DIAGNOSIS — Z86010 Personal history of colonic polyps: Secondary | ICD-10-CM | POA: Diagnosis not present

## 2022-04-26 MED ORDER — SODIUM CHLORIDE 0.9 % IV SOLN: 500.0000 mL | Freq: Once | INTRAVENOUS | Status: DC

## 2022-04-26 NOTE — Progress Notes (Signed)
GASTROENTEROLOGY PROCEDURE H&P NOTE   Primary Care Physician: Shade Flood, MD    Reason for Procedure:  Colon Cancer screening, colon polyp surveillance  Plan:    Colonoscopy  Patient is appropriate for endoscopic procedure(s) in the ambulatory ([facility]) setting.  The nature of the procedure, as well as the risks, benefits, and alternatives were carefully and thoroughly reviewed with the patient. Ample time for discussion and questions allowed. The patient understood, was satisfied, and agreed to proceed.     HPI: Lori Wheeler is a 57 y.o. female who presents for colonoscopy for continued colon polyp surveillance and Colon Cancer screening.  No active GI symptoms.   Does have a history of symptomatic hemorrhoids, and completed hemorrhoid banding series.  Endoscopic history: - Colonoscopy (05/2018, Dr. Barron Alvine, routine CRC screening): 3 tubular adenomas, one sessile serrated polyp, all <10 mm; normal TI, internal hemorrhoids.  Repeat in 3 years  Past Medical History:  Diagnosis Date   Allergy    Claritin daily   Anxiety     Past Surgical History:  Procedure Laterality Date   Admission     [facility] admission; 2 weeks.   COLONOSCOPY     MANDIBLE SURGERY  01/21/1989    Prior to Admission medications   Medication Sig Start Date End Date Taking? Authorizing Provider  b complex vitamins tablet Take 1 tablet by mouth daily.   Yes [provider]  Calcium-Magnesium 100-50 MG TABS Take 1 tablet by mouth daily.   Yes [provider]  estradiol (ESTRACE) 1 MG tablet Take 1 mg by mouth daily.   Yes [provider]  loratadine (CLARITIN) 10 MG tablet Take 10 mg by mouth daily.   Yes [provider]  Multiple Vitamin (MULTIVITAMIN) tablet Take 1 tablet by mouth daily.   Yes [provider]  PARoxetine (PAXIL) 10 MG tablet TAKE 1 TABLET (10 MG TOTAL) BY MOUTH EVERY MORNING. 10/08/21  Yes Shade Flood, MD   progesterone (PROMETRIUM) 100 MG capsule Take 100 mg by mouth daily.   Yes [provider]  VITAMIN D, CHOLECALCIFEROL, PO Take 1,000 Units by mouth daily.   Yes [provider]  lidocaine (XYLOCAINE) 2 % solution 5 ml swish and swallow every 6 hrs as needed 04/05/22   Sheliah Hatch, MD    Current Outpatient Medications  Medication Sig Dispense Refill   b complex vitamins tablet Take 1 tablet by mouth daily.     Calcium-Magnesium 100-50 MG TABS Take 1 tablet by mouth daily.     estradiol (ESTRACE) 1 MG tablet Take 1 mg by mouth daily.     loratadine (CLARITIN) 10 MG tablet Take 10 mg by mouth daily.     Multiple Vitamin (MULTIVITAMIN) tablet Take 1 tablet by mouth daily.     PARoxetine (PAXIL) 10 MG tablet TAKE 1 TABLET (10 MG TOTAL) BY MOUTH EVERY MORNING. 90 tablet 3   progesterone (PROMETRIUM) 100 MG capsule Take 100 mg by mouth daily.     VITAMIN D, CHOLECALCIFEROL, PO Take 1,000 Units by mouth daily.     lidocaine (XYLOCAINE) 2 % solution 5 ml swish and swallow every 6 hrs as needed 100 mL 0   Current Facility-Administered Medications  Medication Dose Route Frequency Provider Last Rate Last Admin   0.9 %  sodium chloride infusion  500 mL Intravenous Once Evadna Donaghy V, DO        Allergies as of 04/26/2022   (No Known Allergies)  Family History  Problem Relation Age of Onset   Other Mother        bowel obstruction   Cancer Father        brain tumor   Diabetes Brother    Hypertension Brother    Hyperlipidemia Brother    Cancer Brother        kidney cancer   Hyperlipidemia Brother    Hypertension Brother    Hyperlipidemia Brother    Hypertension Brother    Colon cancer Neg Hx    Esophageal cancer Neg Hx    Rectal cancer Neg Hx    Stomach cancer Neg Hx    Colon polyps Neg Hx    Crohn's disease Neg Hx    Ulcerative colitis Neg Hx     Social History   Socioeconomic History   Marital status: Married    Spouse name: Not on file    Number of children: 0   Years of education: Not on file   Highest education level: Not on file  Occupational History   Occupation: Quarry managercomputer programmer  Tobacco Use   Smoking status: Former    Years: 1    Types: Cigarettes    Quit date: 01/22/1983    Years since quitting: 39.2   Smokeless tobacco: Never   Tobacco comments:    high school only   Vaping Use   Vaping Use: Never used  Substance and Sexual Activity   Alcohol use: Not Currently    Comment: beer - 6 pack/week   Drug use: Never   Sexual activity: Yes    Birth control/protection: Post-menopausal  Other Topics Concern   Not on file  Social History Narrative   Marital status:  Married x 28 years      Children: none; one dog      Lives: with husband, dog.       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      Diet:  VEGETARIAN; Tofu and beans; smoothies with protein powder; B complex   Social Determinants of Health   Financial Resource Strain: Not on file  Food Insecurity: Not on file  Transportation Needs: Not on file  Physical Activity: Not on file  Stress: Not on file  Social Connections: Not on file  Intimate Partner Violence: Not on file    Physical Exam: Vital signs in last 24 hours: @BP  106/65   Pulse 73   Temp (!) 97.5 F (36.4 C)   Ht 5\' 3"  (1.6 m)   Wt 168 lb (76.2 kg)   LMP 10/18/2018   SpO2 97%   BMI 29.76 kg/m  GEN: NAD EYE: Sclerae anicteric ENT: MMM CV: Non-tachycardic Pulm: CTA b/l GI: Soft, NT/ND NEURO:  Alert & Oriented x 3   Doristine LocksVito Archit Leger, DO [facility]   04/26/2022 8:20 AM

## 2022-04-26 NOTE — Patient Instructions (Addendum)
   Handouts on polyps & hemorrhoids given to you today  Await pathology results on polyps removed    YOU HAD AN ENDOSCOPIC PROCEDURE TODAY AT THE [facility]:   Refer to the procedure report that was given to you for any specific questions about what was found during the examination.  If the procedure report does not answer your questions, please call your gastroenterologist to clarify.  If you requested that your care partner not be given the details of your procedure findings, then the procedure report has been included in a sealed envelope for you to review at your convenience later.  YOU SHOULD EXPECT: Some feelings of bloating in the abdomen. Passage of more gas than usual.  Walking can help get rid of the air that was put into your GI tract during the procedure and reduce the bloating. If you had a lower endoscopy (such as a colonoscopy or flexible sigmoidoscopy) you may notice spotting of blood in your stool or on the toilet paper. If you underwent a bowel prep for your procedure, you may not have a normal bowel movement for a few days.  Please Note:  You might notice some irritation and congestion in your nose or some drainage.  This is from the oxygen used during your procedure.  There is no need for concern and it should clear up in a day or so.  SYMPTOMS TO REPORT IMMEDIATELY:  Following lower endoscopy (colonoscopy or flexible sigmoidoscopy):  Excessive amounts of blood in the stool  Significant tenderness or worsening of abdominal pains  Swelling of the abdomen that is new, acute  Fever of 100F or higher   For urgent or emergent issues, a gastroenterologist can be reached at any hour by calling [phone]. Do not use MyChart messaging for urgent concerns.    DIET:  We do recommend a small meal at first, but then you may proceed to your regular diet.  Drink plenty of fluids but you should avoid alcoholic beverages for 24 hours.  ACTIVITY:  You should plan to  take it easy for the rest of today and you should NOT DRIVE or use heavy machinery until tomorrow (because of the sedation medicines used during the test).    FOLLOW UP: Our staff will call the number listed on your records the next business day following your procedure.  We will call around 7:15- 8:00 am to check on you and address any questions or concerns that you may have regarding the information given to you following your procedure. If we do not reach you, we will leave a message.     If any biopsies were taken you will be contacted by phone or by letter within the next 1-3 weeks.  Please call us at [phone] if you have not heard about the biopsies in 3 weeks.    SIGNATURES/CONFIDENTIALITY: You and/or your care partner have signed paperwork which will be entered into your electronic medical record.  These signatures attest to the fact that that the information above on your After Visit Summary has been reviewed and is understood.  Full responsibility of the confidentiality of this discharge information lies with you and/or your care-partner. 

## 2022-04-26 NOTE — Progress Notes (Signed)
Called to room to assist during endoscopic procedure.  Patient ID and intended procedure confirmed with present staff. Received instructions for my participation in the procedure from the performing physician.  

## 2022-04-26 NOTE — Op Note (Signed)
[facility] Patient Name: Lori Wheeler Procedure Date: 04/26/2022 8:21 AM MRN: [id number] Endoscopist: Doristine Locks , MD, [id number] Age: 57 Referring MD:  Date of Birth: 07-Mar-1965 Gender: Female Account #: [id number] Procedure:                Colonoscopy Indications:              Surveillance: Personal history of adenomatous                            polyps on last colonoscopy > 3 years ago                           Endoscopic history:                           ?" Colonoscopy (05/2018, Dr. Barron Alvine, routine CRC                            screening): 3 tubular adenomas, one sessile                            serrated polyp, all <10 mm; normal TI, internal                            hemorrhoids. Repeat in 3 years Medicines:                Monitored Anesthesia Care Procedure:                Pre-Anesthesia Assessment:                           - Prior to the procedure, a History and Physical                            was performed, and patient medications and                            allergies were reviewed. The patient's tolerance of                            previous anesthesia was also reviewed. The risks                            and benefits of the procedure and the sedation                            options and risks were discussed with the patient.                            All questions were answered, and informed consent                            was obtained. Prior Anticoagulants: The patient has  taken no anticoagulant or antiplatelet agents. ASA                            Grade Assessment: II - A patient with mild systemic                            disease. After reviewing the risks and benefits,                            the patient was deemed in satisfactory condition to                            undergo the procedure.                           After obtaining informed consent, the colonoscope                             was passed under direct vision. Throughout the                            procedure, the patient's blood pressure, pulse, and                            oxygen saturations were monitored continuously. The                            Olympus SN [id number] was introduced through the anus                            and advanced to the the terminal ileum. The                            colonoscopy was performed without difficulty. The                            patient tolerated the procedure well. The quality                            of the bowel preparation was good. The terminal                            ileum, ileocecal valve, appendiceal orifice, and                            rectum were photographed. Scope In: 8:28:02 AM Scope Out: 8:43:22 AM Scope Withdrawal Time: 0 hours 12 minutes 15 seconds  Total Procedure Duration: 0 hours 15 minutes 20 seconds  Findings:                 The perianal and digital rectal examinations were                            normal.  A 5 mm polyp was found in the cecum. The polyp was                            mucous-capped and sessile. The polyp was removed                            with a cold snare. Resection and retrieval were                            complete. Estimated blood loss was minimal.                           A 3 mm polyp was found in the distal sigmoid colon.                            The polyp was sessile. The polyp was removed with a                            cold snare. Resection and retrieval were complete.                            Estimated blood loss was minimal.                           One column of non-bleeding internal hemorrhoids                            were found during retroflexion. The hemorrhoids                            were small and Grade I (internal hemorrhoids that                            do not prolapse).                           A few well-healed scars were found in the distal                             rectum, consistent with previous hemorrhoid                            banding. The scar tissue was healthy in appearance.                           The terminal ileum appeared normal. Complications:            No immediate complications. Estimated Blood Loss:     Estimated blood loss was minimal. Impression:               - One 5 mm polyp in the cecum, removed with a cold                            snare.  Resected and retrieved.                           - One 3 mm polyp in the distal sigmoid colon,                            removed with a cold snare. Resected and retrieved.                           - Non-bleeding internal hemorrhoids.                           - Scar in the distal rectum.                           - The examined portion of the ileum was normal.                           - The GI Genius (intelligent endoscopy module),                            computer-aided polyp detection system powered by AI                            was utilized to detect colorectal polyps through                            enhanced visualization during colonoscopy. Recommendation:           - Patient has a contact number available for                            emergencies. The signs and symptoms of potential                            delayed complications were discussed with the                            patient. Return to normal activities tomorrow.                            Written discharge instructions were provided to the                            patient.                           - Resume previous diet.                           - Continue present medications.                           - Await pathology results.                           -  Repeat colonoscopy for surveillance based on                            pathology results.                           - Return to GI office PRN.                           - If recurrent hemorrhoidal symptoms, can return  to                            the office for repeat banding of any remnant                            hemorrhoid column(s). Doristine Locks, MD 04/26/2022 8:49:41 AM

## 2022-04-26 NOTE — Progress Notes (Signed)
Report given to PACU, vss 

## 2022-04-26 NOTE — Progress Notes (Signed)
Pt's states no medical or surgical changes since previsit or office visit. 

## 2022-04-29 ENCOUNTER — Telehealth: Payer: Self-pay | Admitting: *Deleted

## 2022-04-29 NOTE — Telephone Encounter (Signed)
  Follow up Call-     04/26/2022    7:38 AM  Call back number  Post procedure Call Back phone  # [phone]  Permission to leave phone message Yes     Patient questions:  Do you have a fever, pain , or abdominal swelling? No. Pain Score  0 *  Have you tolerated food without any problems? Yes.    Have you been able to return to your normal activities? Yes.    Do you have any questions about your discharge instructions: Diet   No. Medications  No. Follow up visit  No.  Do you have questions or concerns about your Care? No.  Actions: * If pain score is 4 or above: No action needed, pain <4.

## 2022-05-02 ENCOUNTER — Encounter: Payer: Self-pay | Admitting: Gastroenterology

## 2022-06-21 IMAGING — DX DG HAND 2V*R*
2 series · 2 of 2 positions shown · non-contrast
Comparison: None.

CLINICAL DATA: Bilateral hand pain

EXAM:
LEFT HAND - 2 VIEW; RIGHT HAND - 2 VIEW

[hand ap]
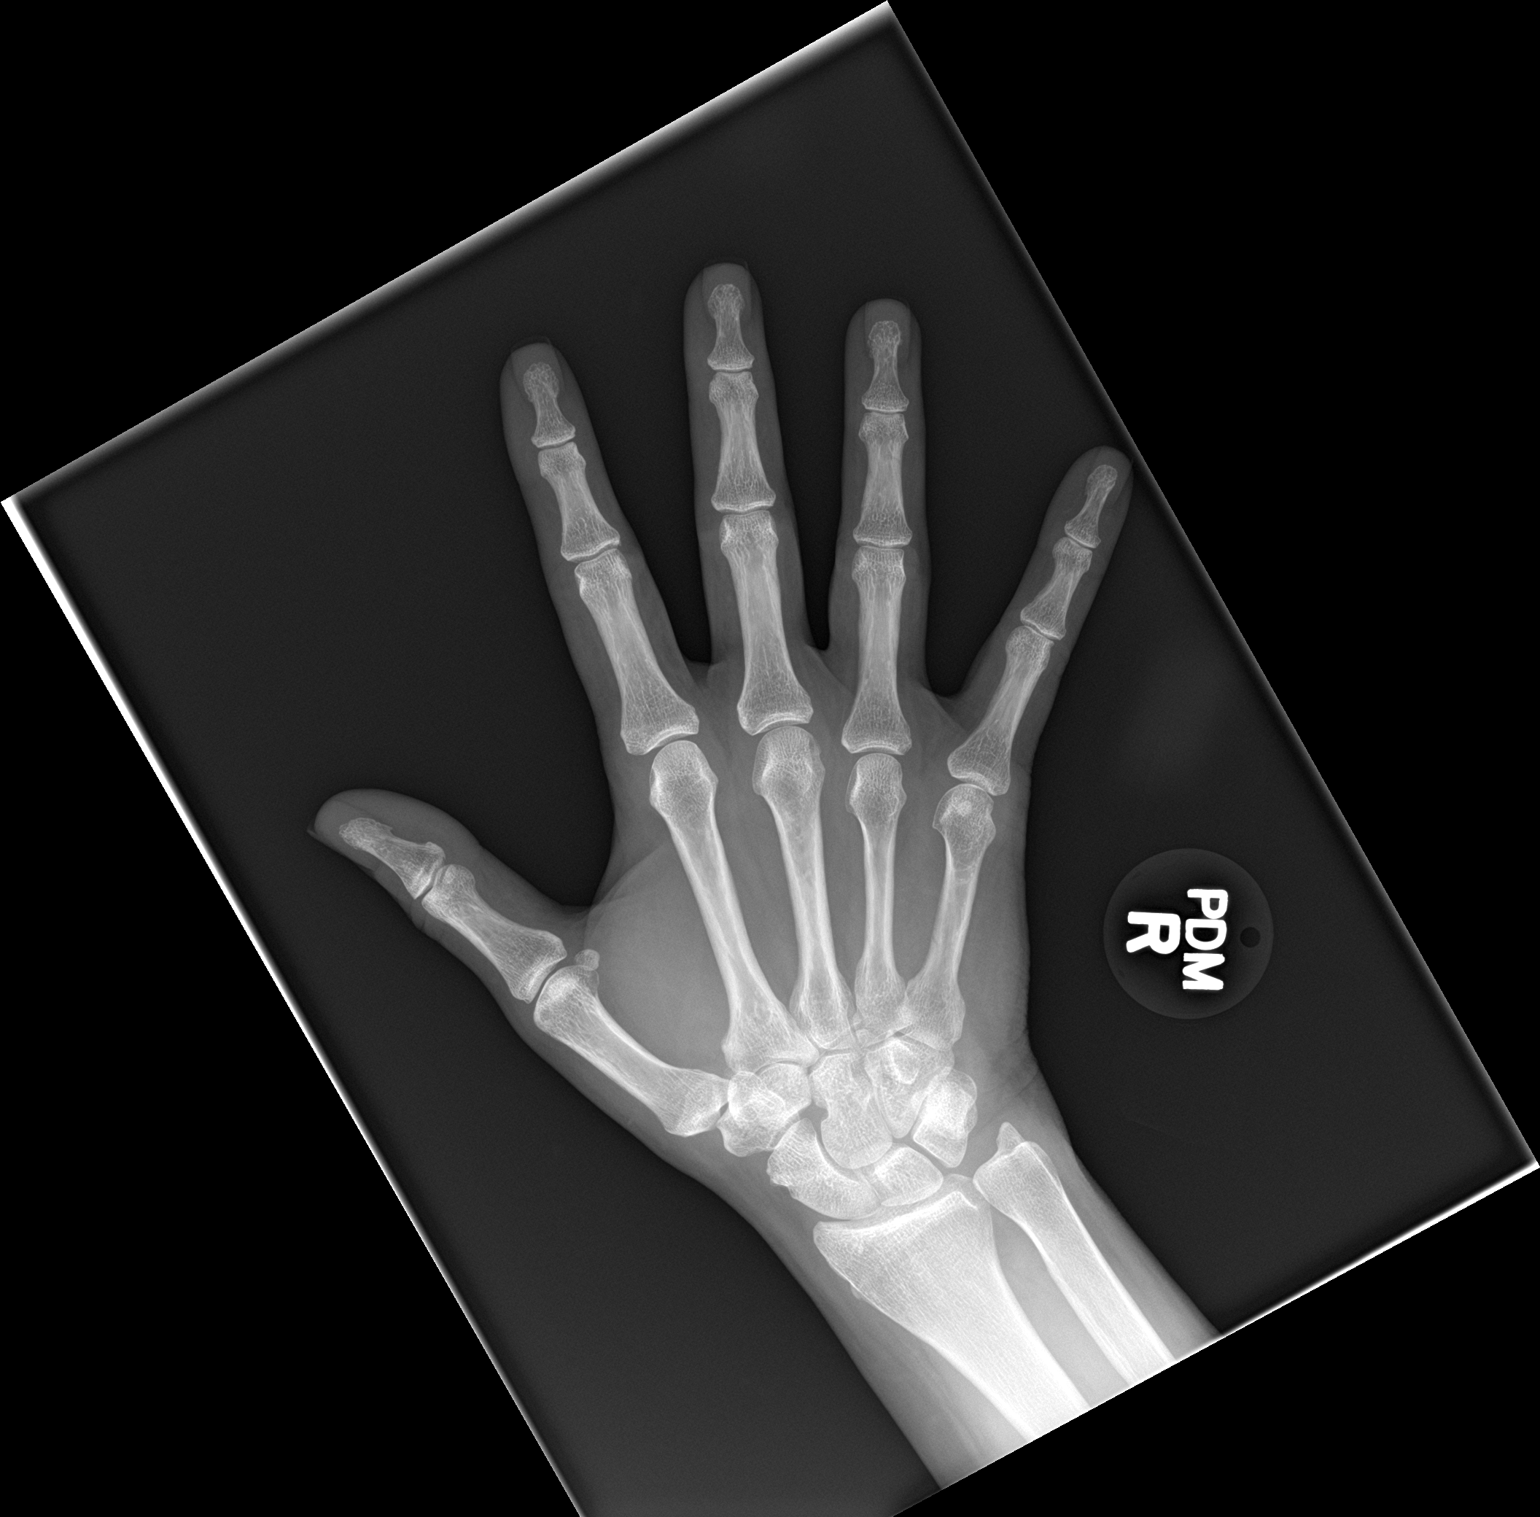

[hand lat]
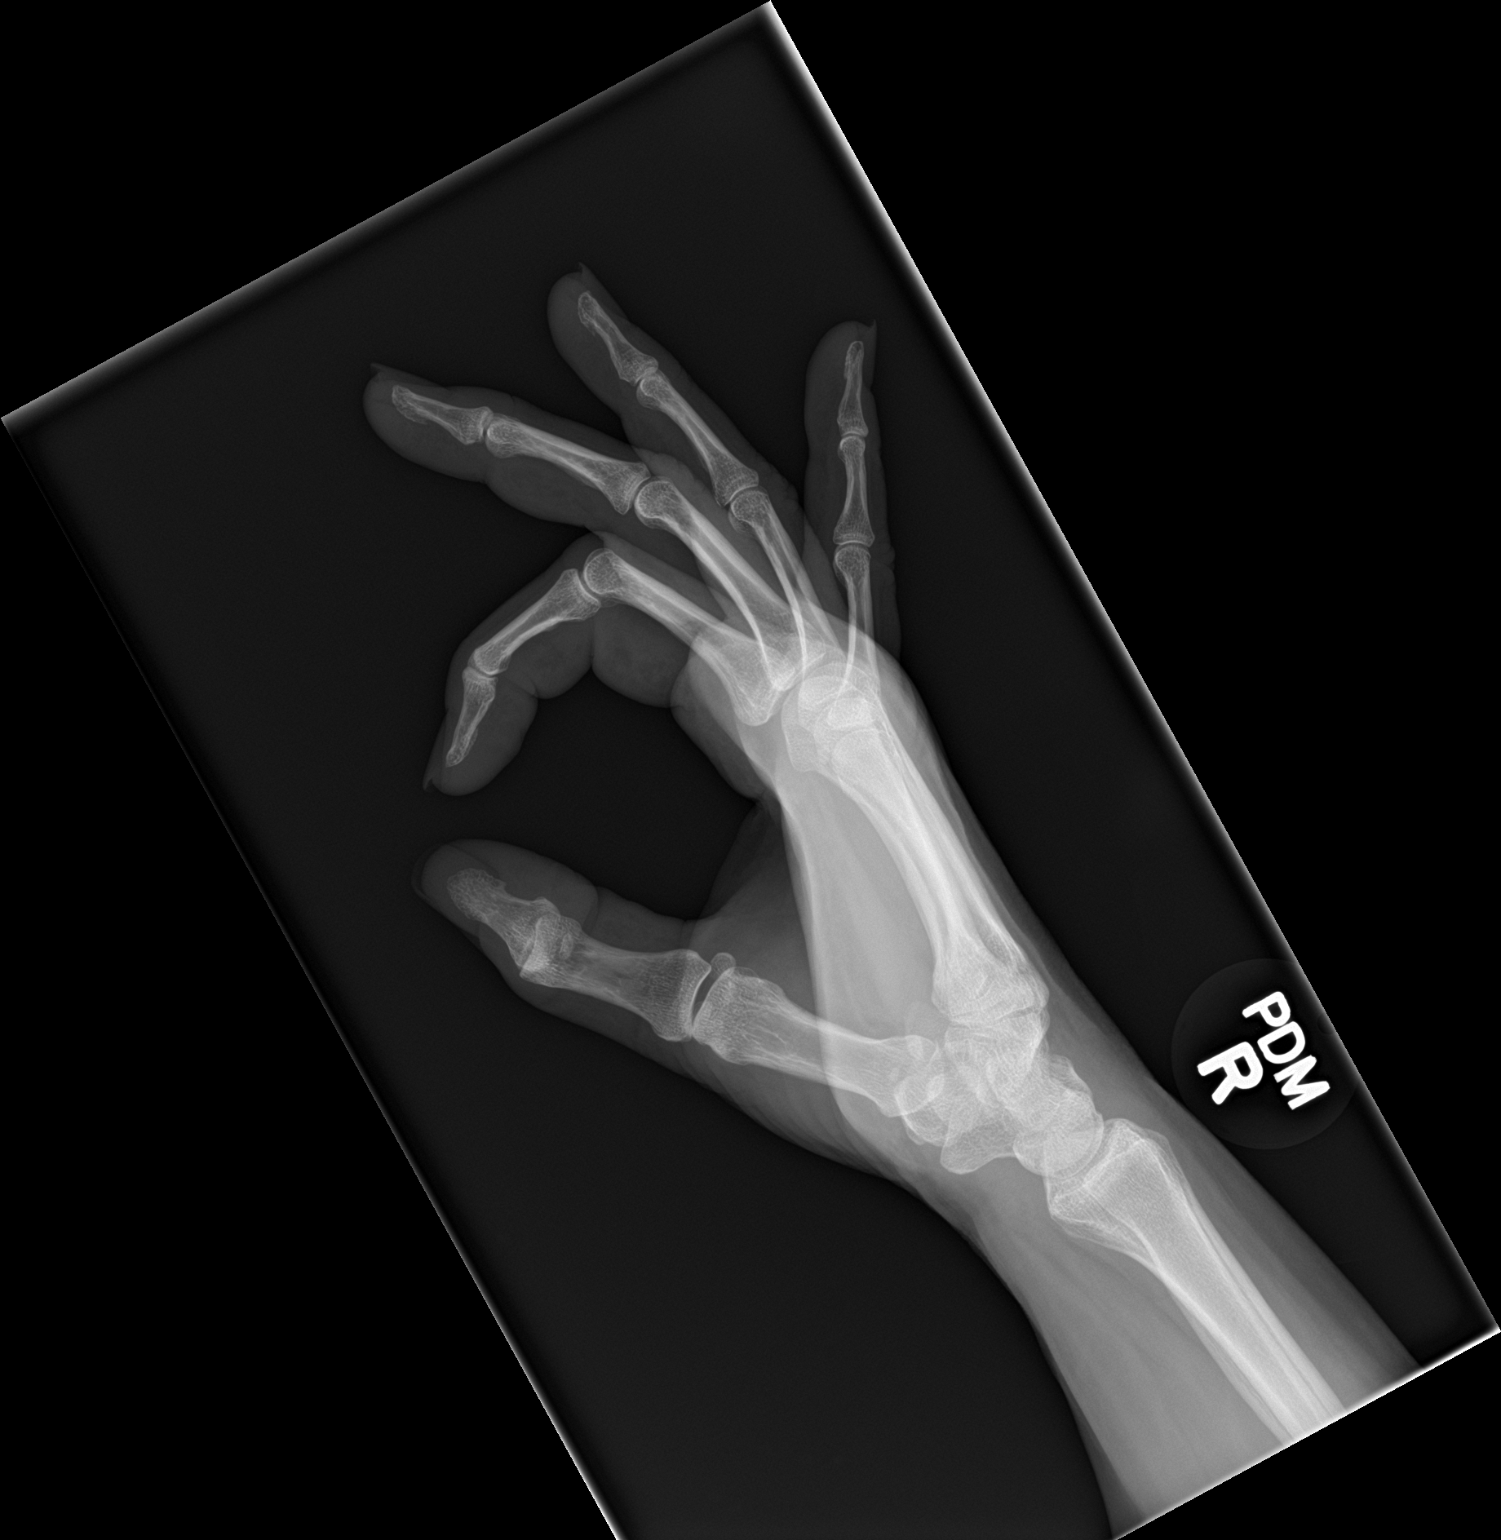

[2 of 2 positions shown; findings below may reference images not displayed]

FINDINGS: No acute fracture or dislocation of the bilateral hands. Normal bony
mineralization. Joint spaces are maintained. No bony erosion or
periostitis. No evidence of other focal bone abnormality. No
abnormal soft tissue mineralization. No focal soft tissue swelling.
IMPRESSION: No acute osseous abnormality or significant arthropathy of the
bilateral hands.

## 2022-06-21 IMAGING — DX DG HAND 2V*L*
2 series · 2 of 2 positions shown · non-contrast
Comparison: None.

CLINICAL DATA: Bilateral hand pain

EXAM:
LEFT HAND - 2 VIEW; RIGHT HAND - 2 VIEW

[hand ap]
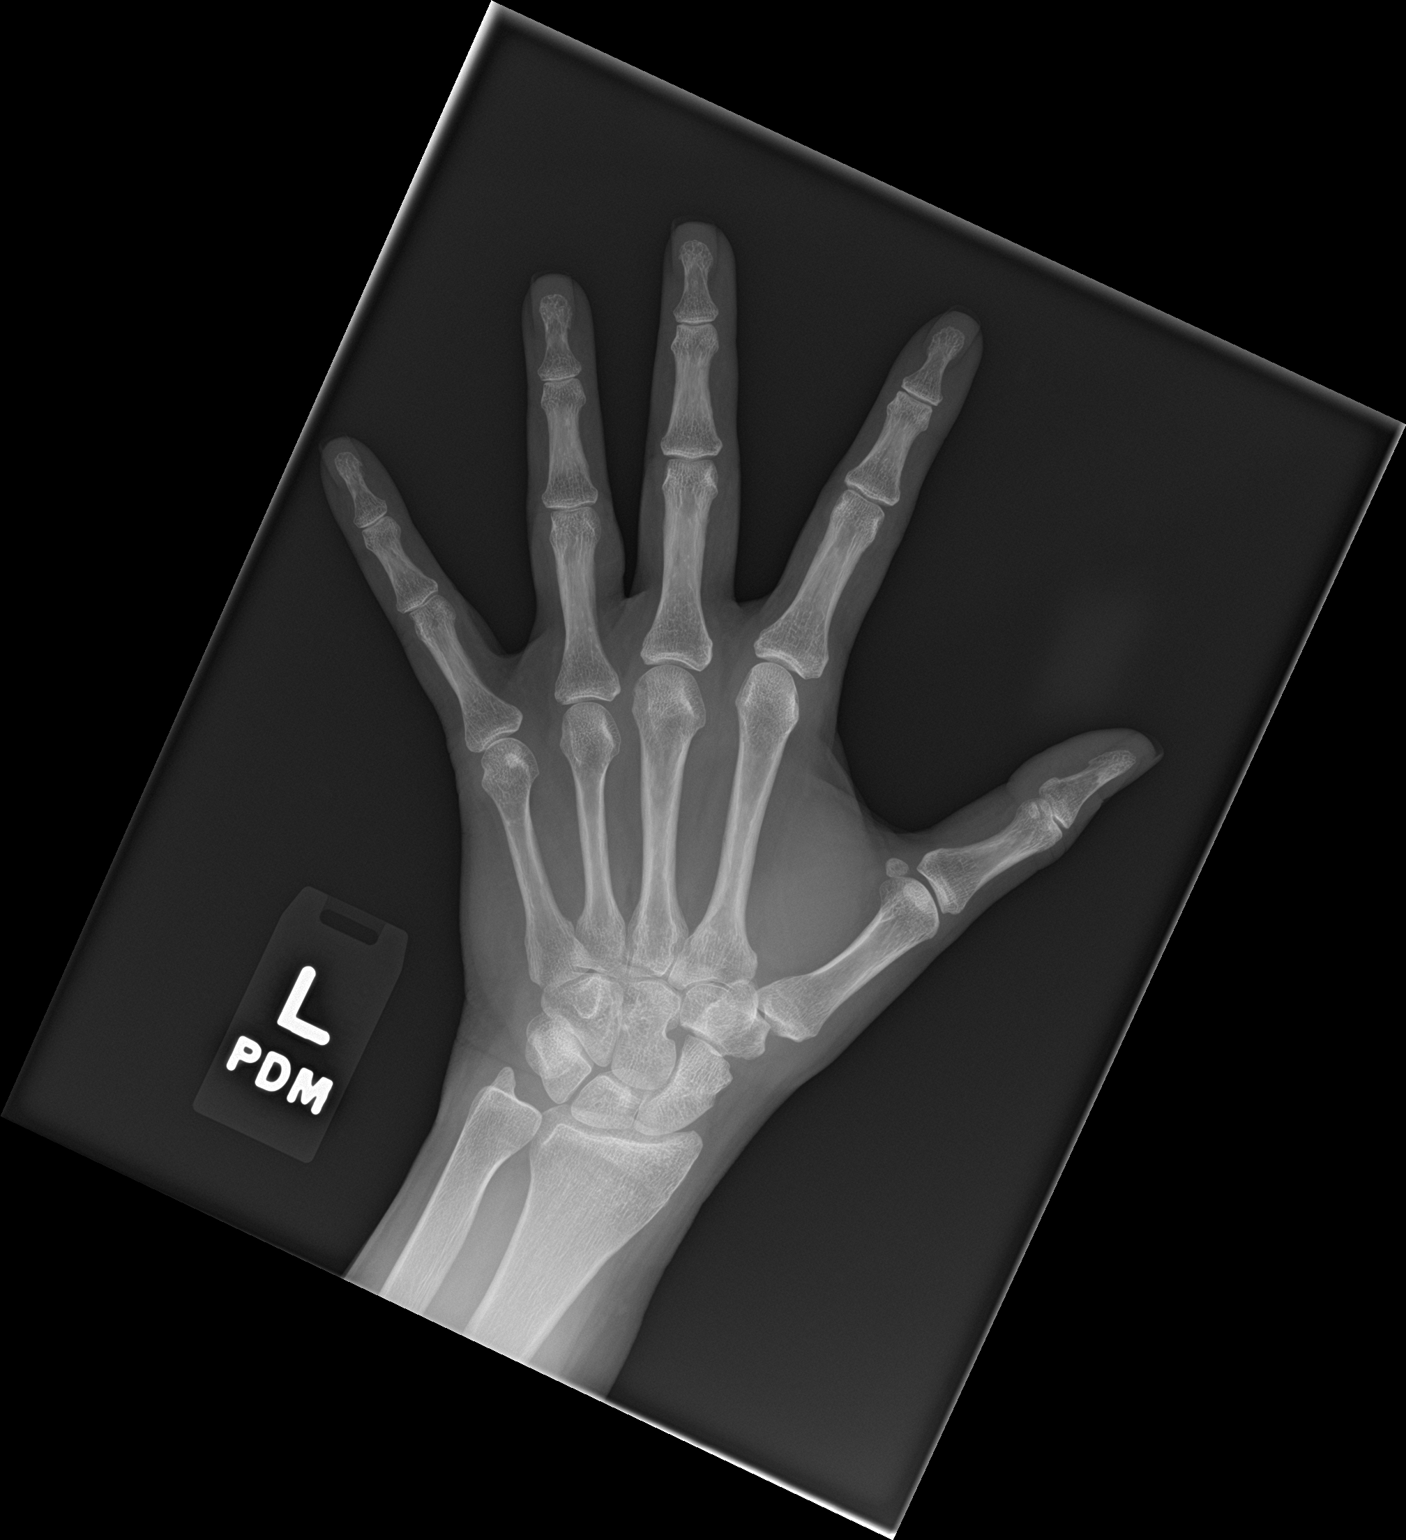

[hand lat]
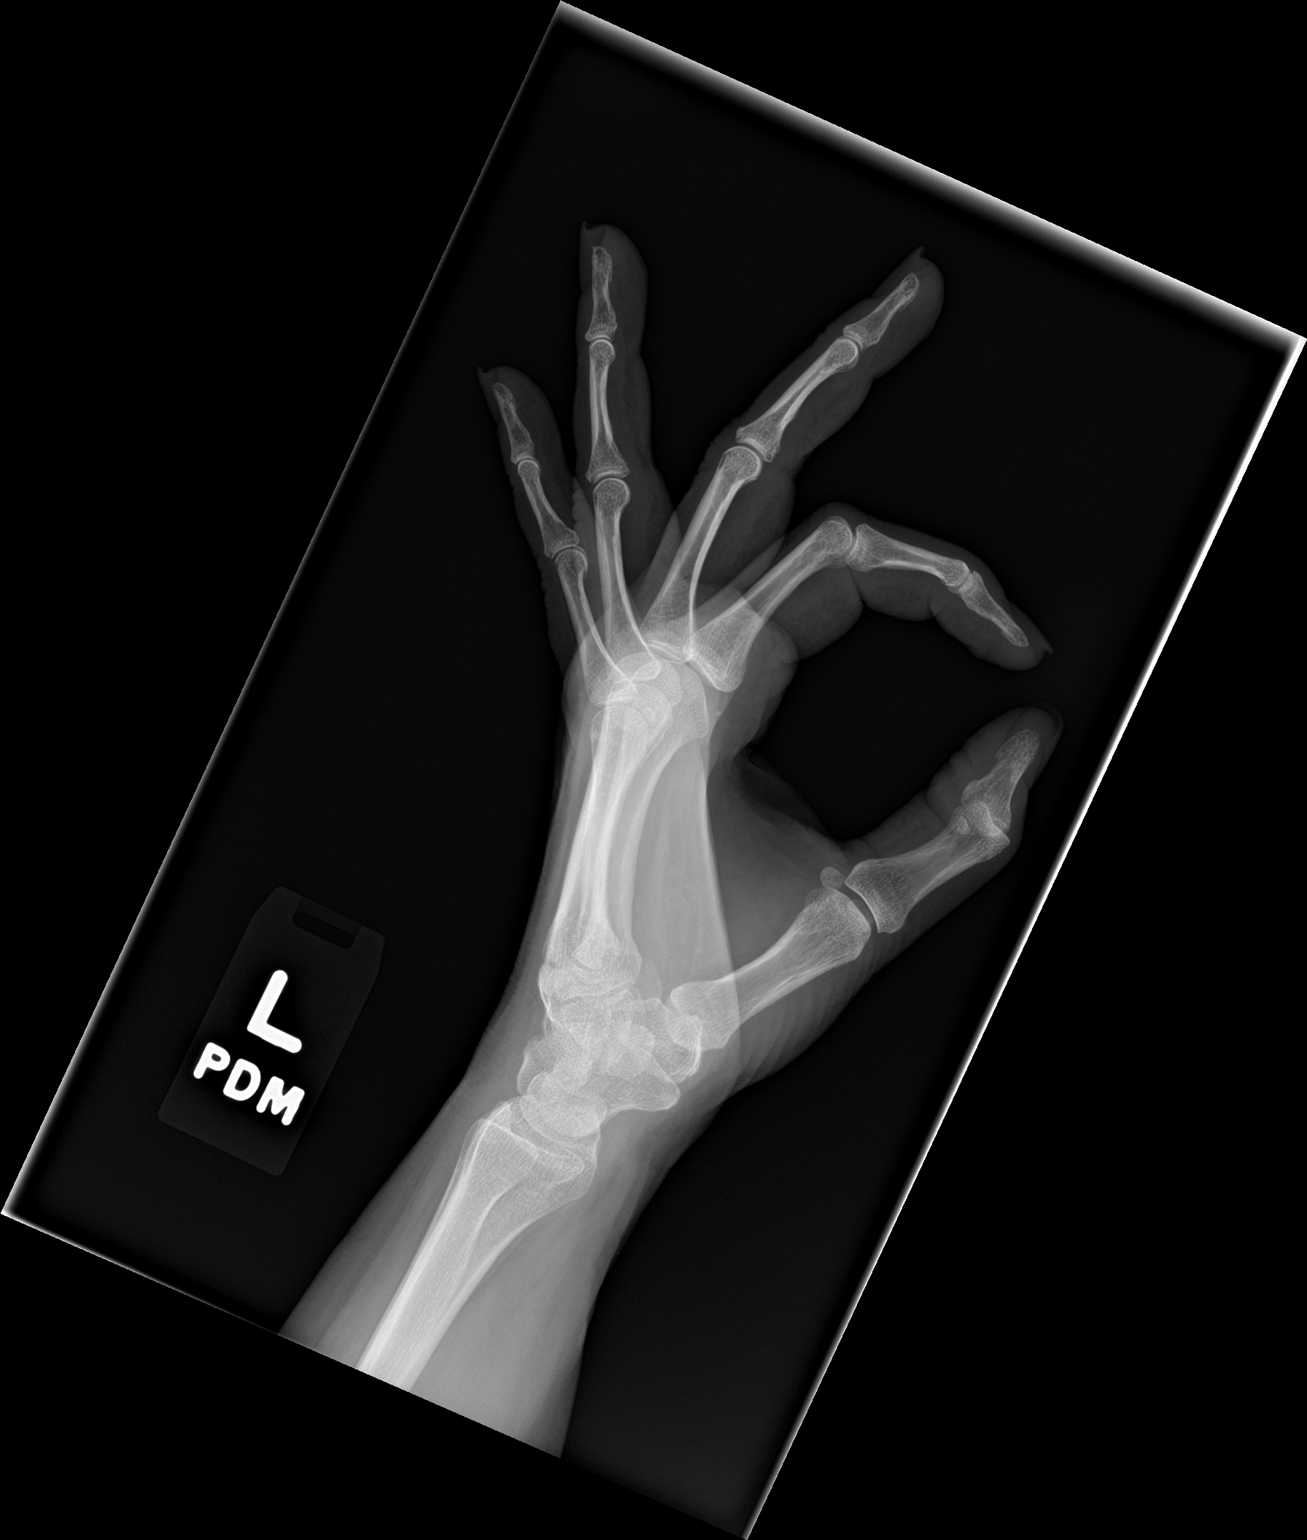

[2 of 2 positions shown; findings below may reference images not displayed]

FINDINGS: No acute fracture or dislocation of the bilateral hands. Normal bony
mineralization. Joint spaces are maintained. No bony erosion or
periostitis. No evidence of other focal bone abnormality. No
abnormal soft tissue mineralization. No focal soft tissue swelling.
IMPRESSION: No acute osseous abnormality or significant arthropathy of the
bilateral hands.

## 2022-09-03 ENCOUNTER — Other Ambulatory Visit: Payer: Self-pay | Admitting: Family Medicine

## 2022-09-03 DIAGNOSIS — F411 Generalized anxiety disorder: Secondary | ICD-10-CM

## 2022-12-16 ENCOUNTER — Ambulatory Visit: Payer: BC Managed Care – PPO | Admitting: Family Medicine

## 2022-12-17 ENCOUNTER — Ambulatory Visit: Payer: BC Managed Care – PPO | Admitting: Family Medicine

## 2022-12-17 ENCOUNTER — Encounter: Payer: Self-pay | Admitting: Family Medicine

## 2022-12-17 VITALS — BP 110/80 | HR 72 | Temp 98.0°F | Ht 63.0 in | Wt 169.0 lb

## 2022-12-17 DIAGNOSIS — R42 Dizziness and giddiness: Secondary | ICD-10-CM

## 2022-12-17 MED ORDER — ONDANSETRON HCL 4 MG PO TABS: 4.0000 mg | ORAL_TABLET | Freq: Three times a day (TID) | ORAL | 0 refills | Status: AC | PRN

## 2022-12-17 NOTE — Progress Notes (Signed)
   Subjective:    Patient ID: Lori Wheeler, female    DOB: 04-01-1965, 57 y.o.   MRN: [id number]  HPI Vertigo- pt reports she had BPPV in 2018.  Thursday woke w/ similar sxs.  Husband tried to do the Eppley maneuver but 'it was awful'.  Pt notes that lying on the L side 'makes it a horrible day'.  If she is lying on the R side, 'it's manageable'.  Saw Dr Jenne Pane in 2018 and is wondering if she needs to go back.   Review of Systems For ROS see HPI     Objective:   Physical Exam Vitals reviewed.  Constitutional:      General: She is not in acute distress.    Appearance: Normal appearance. She is well-developed. She is not ill-appearing.  HENT:     Head: Normocephalic and atraumatic.     Right Ear: Tympanic membrane and ear canal normal.     Left Ear: Tympanic membrane and ear canal normal.     Nose: No congestion or rhinorrhea.     Comments: No TTP over frontal or maxillary sinuses    Mouth/Throat:     Mouth: Mucous membranes are normal.     Pharynx: Uvula midline.  Eyes:     Extraocular Movements: Extraocular movements intact and EOM normal.     Conjunctiva/sclera: Conjunctivae normal.     Pupils: Pupils are equal, round, and reactive to light.     Comments: 2-3 beats of horizontal nystagmus  Cardiovascular:     Rate and Rhythm: Normal rate and regular rhythm.     Heart sounds: Normal heart sounds.  Pulmonary:     Effort: Pulmonary effort is normal. No respiratory distress.     Breath sounds: Normal breath sounds. No wheezing or rales.  Musculoskeletal:        General: No edema.     Cervical back: Normal range of motion and neck supple.  Lymphadenopathy:     Cervical: No cervical adenopathy.  Skin:    General: Skin is warm and dry.  Neurological:     General: No focal deficit present.     Mental Status: She is alert and oriented to person, place, and time.     Cranial Nerves: No cranial nerve deficit.     Deep Tendon Reflexes: Reflexes are normal and symmetric.   Psychiatric:        Mood and Affect: Mood and affect normal.        Behavior: Behavior normal.        Thought Content: Thought content normal.        Judgment: Judgment normal.           Assessment & Plan:  Vertigo- new to provider.  2nd episode for pt.  Last episode 2018.  At that time, saw Dr Jenne Pane (ENT) but by the time of her appt, sxs had resolved.  Reports that if she lies on her L side to sleep, sxs are 'horrible'.  If she lies on her R side, sxs are 'manageable'.  She has Meclizine at home but doesn't feel this is helpful.  Zofran given to use as needed for nausea.  Handouts printed for modified Epley maneuver.  Will refer back to ENT in case sxs don't resolve.  If persistent, may need neuro rehab.  Pt expressed understanding and is in agreement w/ plan.

## 2022-12-17 NOTE — Patient Instructions (Signed)
Follow up as needed or as scheduled Drink LOTS of fluids Dramamine or OTC Meclizine as needed Ondansetron (Zofran) for nausea Change head positions SLOWLY Do the exercises on the handout to help desensitize the inner ear canal ENT should call you to schedule- if all is well, just cancel Call with any questions or concerns Hang in there! Happy Thanksgiving!!

## 2023-01-28 ENCOUNTER — Ambulatory Visit: Payer: Self-pay | Admitting: Family Medicine

## 2023-01-28 ENCOUNTER — Ambulatory Visit: Payer: 59 | Admitting: Family

## 2023-01-28 ENCOUNTER — Encounter: Payer: Self-pay | Admitting: Family

## 2023-01-28 VITALS — BP 128/72 | HR 78 | Temp 97.9°F | Ht 63.0 in | Wt 170.0 lb

## 2023-01-28 DIAGNOSIS — J069 Acute upper respiratory infection, unspecified: Secondary | ICD-10-CM

## 2023-01-28 DIAGNOSIS — H938X2 Other specified disorders of left ear: Secondary | ICD-10-CM

## 2023-01-28 LAB — POCT INFLUENZA A/B
Influenza A, POC: NEGATIVE
Influenza B, POC: NEGATIVE

## 2023-01-28 MED ORDER — PREDNISONE 20 MG PO TABS: 40.0000 mg | ORAL_TABLET | Freq: Every day | ORAL | 0 refills | Status: AC

## 2023-01-28 MED ORDER — PROMETHAZINE-DM 6.25-15 MG/5ML PO SYRP
5.0000 mL | ORAL_SOLUTION | Freq: Four times a day (QID) | ORAL | 0 refills | Status: AC | PRN
Start: 2023-01-28 — End: ?

## 2023-01-28 NOTE — Progress Notes (Signed)
 Acute Office Visit  Subjective:     Patient ID: Lori Wheeler, female    DOB: [birth year], 58 y.o.   MRN: [id number]  Chief Complaint  Patient presents with   Cough    Notes dry cough, yellow sinus drainage, hoarse, ear pressure, started last Sunday 01/19/2023 notes worst at night    HPI Patient is in today with c/o dry cough, sinus drainage, hoarseness, ear pressure x 1 week. She traveled to [place] over the holidays. She has been taking Nyquil and Ibuprofen that has not helped.   Review of Systems  Constitutional: Negative.   HENT:  Positive for congestion.        Ears feeling clogged  Respiratory:  Positive for cough.   Cardiovascular: Negative.   Musculoskeletal: Negative.   Endo/Heme/Allergies: Negative.   Psychiatric/Behavioral: Negative.    All other systems reviewed and are negative.  Past Medical History:  Diagnosis Date   Allergy    Claritin daily   Anxiety     Social History   Socioeconomic History   Marital status: Married    Spouse name: Not on file   Number of children: 0   Years of education: Not on file   Highest education level: Not on file  Occupational History   Occupation: quarry manager  Tobacco Use   Smoking status: Former    Current packs/day: 0.00    Types: Cigarettes    Start date: 01/21/1982    Quit date: 01/22/1983    Years since quitting: 40.0   Smokeless tobacco: Never   Tobacco comments:    high school only   Vaping Use   Vaping status: Never Used  Substance and Sexual Activity   Alcohol use: Not Currently    Comment: beer - 6 pack/week   Drug use: Never   Sexual activity: Yes    Birth control/protection: Post-menopausal  Other Topics Concern   Not on file  Social History Narrative   Marital status:  Married x 28 years      Children: none; one dog      Lives: with husband, dog.       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      Diet:  VEGETARIAN; Tofu and beans; smoothies with protein powder; B complex   Social Drivers of Corporate Investment Banker Strain: Not on file  Food Insecurity: Not on file  Transportation Needs: Not on file  Physical Activity: Not on file  Stress: Not on file  Social Connections: Not on file  Intimate Partner Violence: Not on file    Past Surgical History:  Procedure Laterality Date   Admission     Behavioral Health admission; 2 weeks.   COLONOSCOPY     MANDIBLE SURGERY  01/21/1989    Family History  Problem Relation Age of Onset   Other Mother        bowel obstruction   Cancer Father        brain tumor   Diabetes Brother    Hypertension Brother    Hyperlipidemia Brother    Cancer Brother        kidney cancer   Hyperlipidemia Brother    Hypertension Brother    Hyperlipidemia Brother    Hypertension  Brother    Colon cancer Neg Hx    Esophageal cancer Neg Hx    Rectal cancer Neg Hx    Stomach cancer Neg Hx    Colon polyps Neg Hx    Crohn's disease Neg Hx    Ulcerative colitis Neg Hx     No Known Allergies  Current Outpatient Medications on File Prior to Visit  Medication Sig Dispense Refill   b complex vitamins tablet Take 1 tablet by mouth daily.     Calcium-Magnesium 100-50 MG TABS Take 1 tablet by mouth daily.     estradiol (ESTRACE) 1 MG tablet Take 1 mg by mouth daily.     lidocaine  (XYLOCAINE ) 2 % solution 5 ml swish and swallow every 6 hrs as needed 100 mL 0   loratadine (CLARITIN) 10 MG tablet Take 10 mg by mouth daily.     Multiple Vitamin (MULTIVITAMIN) tablet Take 1 tablet by mouth daily.     ondansetron  (ZOFRAN ) 4 MG tablet Take 1 tablet (4 mg total) by mouth every 8 (eight) hours as needed for nausea or vomiting. 30 tablet 0   PARoxetine  (PAXIL ) 10 MG tablet TAKE 1 TABLET (10 MG TOTAL) BY MOUTH EVERY MORNING. 90 tablet 3   progesterone (PROMETRIUM) 100 MG capsule Take 100 mg by mouth daily.      VITAMIN D , CHOLECALCIFEROL, PO Take 1,000 Units by mouth daily.     No current facility-administered medications on file prior to visit.    BP 128/72   Pulse 78   Temp 97.9 F (36.6 C) (Temporal)   Ht 5' 3 (1.6 m)   Wt 170 lb (77.1 kg)   LMP 10/18/2018   SpO2 97%   BMI 30.11 kg/m chart      Objective:    BP 128/72   Pulse 78   Temp 97.9 F (36.6 C) (Temporal)   Ht 5' 3 (1.6 m)   Wt 170 lb (77.1 kg)   LMP 10/18/2018   SpO2 97%   BMI 30.11 kg/m    Physical Exam Vitals and nursing note reviewed.  Constitutional:      Appearance: Normal appearance. She is normal weight.  HENT:     Right Ear: Tympanic membrane and ear canal normal.     Left Ear: Tympanic membrane and ear canal normal.     Ears:     Comments: Fluid in the ears bilaterally     Nose: Congestion and rhinorrhea present.     Mouth/Throat:     Mouth: Mucous membranes are moist.  Cardiovascular:     Rate and Rhythm: Normal rate and regular rhythm.  Pulmonary:     Effort: Pulmonary effort is normal.     Breath sounds: Normal breath sounds.  Musculoskeletal:        General: Normal range of motion.  Skin:    General: Skin is warm and dry.  Neurological:     General: No focal deficit present.     Mental Status: She is alert and oriented to person, place, and time. Mental status is at baseline.  Psychiatric:        Mood and Affect: Mood normal.        Behavior: Behavior normal.     Results for orders placed or performed in visit on 01/28/23  POCT Influenza A/B  Result Value Ref Range   Influenza A, POC Negative Negative   Influenza B, POC Negative Negative        Assessment & Plan:   Problem  List Items Addressed This Visit   None Visit Diagnoses       Viral upper respiratory illness    -  Primary     Congestion of left ear       Relevant Orders   POCT Influenza A/B (Completed)       Meds ordered this encounter  Medications   predniSONE  (DELTASONE ) 20 MG tablet    Sig: Take 2  tablets (40 mg total) by mouth daily with breakfast.    Dispense:  10 tablet    Refill:  0   promethazine -dextromethorphan (PROMETHAZINE -DM) 6.25-15 MG/5ML syrup    Sig: Take 5 mLs by mouth 4 (four) times daily as needed.    Dispense:  118 mL    Refill:  0   Call the office if symptoms worsen or persist. Recheck as scheduled and sooner as needed.  No follow-ups on file.  Burdett Pinzon B Mussa Groesbeck, FNP

## 2023-01-28 NOTE — Telephone Encounter (Signed)
 Chief Complaint: cough, ears clogged Symptoms: cough mostly dry, coughing fits, nasal congestion, greenish yellow nasal discharge, sore throat, prior fever, interrupted sleep from cough, ears feel clogged Frequency: continual Pertinent Negatives: Patient denies chest pain, SOB, blood in sputum, current fever Disposition: [] ED /[] Urgent Care (no appt availability in office) / [x] Appointment(In office/virtual)/ []  [facility]/ [] Home Care/ [] Refused Recommended Disposition /[]  Mobile Bus/ []  Follow-up with PCP Additional Notes: Pt reporting she's been experiencing cold symptoms since 12/29, especially cough and clogged ears. Pt reporting she'll cough things up in morning, then evening it's a dry tickle kind of cough. Pt confirms coughing fits especially at night when cough goes bananas and can't sleep. Pt reporting she called because ears are clogged up. Pt confirms greenish yellow mucus from just nose, unable to assess sputum from cough, nothing comes up to the point of spitting it out, but denies coughing up blood. Pt reporting no fever currently, no SOB, tired with exertion but par for the course. Pt confirms no known exposure to covid, at-home covid test was negative. Advised appt today for exam, scheduled for today at PCP office. Pt verbalized understanding to call if worsening or further questions.  Reason for Disposition  Earache is present  Answer Assessment - Initial Assessment Questions 1. ONSET: When did the cough begin?      Started on Dec 29th 2. SEVERITY: How bad is the cough today?      Medium but it'll be bad tonight 3. SPUTUM: Describe the color of your sputum (none, dry cough; clear, white, yellow, green)     Dry cough for most part, some congestion in throat but nothing comes up to point of spitting it out 4. HEMOPTYSIS: Are you coughing up any blood? If so ask: How much? (flecks, streaks, tablespoons, etc.)     denies 5. DIFFICULTY  BREATHING: Are you having difficulty breathing? If Yes, ask: How bad is it? (e.g., mild, moderate, severe)    - MILD: No SOB at rest, mild SOB with walking, speaks normally in sentences, can lie down, no retractions, pulse < 100.    - MODERATE: SOB at rest, SOB with minimal exertion and prefers to sit, cannot lie down flat, speaks in phrases, mild retractions, audible wheezing, pulse 100-120.    - SEVERE: Very SOB at rest, speaks in single words, struggling to breathe, sitting hunched forward, retractions, pulse > 120      Denies SOB 6. FEVER: Do you have a fever? If Yes, ask: What is your temperature, how was it measured, and when did it start?     Had fever 100 F at beginning of illness 7. CARDIAC HISTORY: Do you have any history of heart disease? (e.g., heart attack, congestive heart failure)      denies 8. LUNG HISTORY: Do you have any history of lung disease?  (e.g., pulmonary embolus, asthma, emphysema)     denies 9. PE RISK FACTORS: Do you have a history of blood clots? (or: recent major surgery, recent prolonged travel, bedridden)     denies 10. OTHER SYMPTOMS: Do you have any other symptoms? (e.g., runny nose, wheezing, chest pain)       Ears clogged up, sore throat, nasal congestion, greenish yellow mucus from nose, dry tickle kind of cough, tired, interrupted sleep  Protocols used: Cough - Acute Non-Productive-A-AH

## 2023-06-02 ENCOUNTER — Other Ambulatory Visit: Payer: Self-pay | Admitting: Family

## 2023-06-30 LAB — HM MAMMOGRAPHY

## 2023-07-21 ENCOUNTER — Other Ambulatory Visit: Payer: Self-pay | Admitting: Family Medicine

## 2023-07-21 DIAGNOSIS — F411 Generalized anxiety disorder: Secondary | ICD-10-CM

## 2023-07-21 NOTE — Telephone Encounter (Signed)
 Patient needs an appt has only been seen for acute visits this last year.

## 2023-07-21 NOTE — Telephone Encounter (Unsigned)
 Copied from CRM [id number]. Topic: Clinical - Medication Refill >> Jul 21, 2023  9:51 AM Mesmerise C wrote: Medication:  PARoxetine  (PAXIL ) 10 MG tablet   Has the patient contacted their pharmacy? No (Agent: If no, request that the patient contact the pharmacy for the refill. If patient does not wish to contact the pharmacy document the reason why and proceed with request.) (Agent: If yes, when and what did the pharmacy advise?)  This is the patient's preferred pharmacy:  [facility] [id number] [place], [place] - [address] [place] [address] Phone: [phone] Fax: [phone]  Is this the correct pharmacy for this prescription? Yes If no, delete pharmacy and type the correct one.   Has the prescription been filled recently? No  Is the patient out of the medication? No  Has the patient been seen for an appointment in the last year OR does the patient have an upcoming appointment? Yes  Can we respond through MyChart? Yes  Agent: Please be advised that Rx refills may take up to 3 business days. We ask that you follow-up with your pharmacy.

## 2023-07-22 MED ORDER — PAROXETINE HCL 10 MG PO TABS: 10.0000 mg | ORAL_TABLET | ORAL | 3 refills | Status: DC

## 2023-08-11 ENCOUNTER — Encounter: Payer: Self-pay | Admitting: Family Medicine

## 2023-08-11 ENCOUNTER — Ambulatory Visit (INDEPENDENT_AMBULATORY_CARE_PROVIDER_SITE_OTHER): Admitting: Family Medicine

## 2023-08-11 VITALS — BP 110/60 | HR 65 | Temp 98.0°F | Ht 62.5 in | Wt 160.0 lb

## 2023-08-11 DIAGNOSIS — Z1329 Encounter for screening for other suspected endocrine disorder: Secondary | ICD-10-CM | POA: Diagnosis not present

## 2023-08-11 DIAGNOSIS — E785 Hyperlipidemia, unspecified: Secondary | ICD-10-CM | POA: Diagnosis not present

## 2023-08-11 DIAGNOSIS — Z130 Encounter for screening for diseases of the blood and blood-forming organs and certain disorders involving the immune mechanism: Secondary | ICD-10-CM | POA: Diagnosis not present

## 2023-08-11 DIAGNOSIS — F411 Generalized anxiety disorder: Secondary | ICD-10-CM

## 2023-08-11 DIAGNOSIS — Z0184 Encounter for antibody response examination: Secondary | ICD-10-CM

## 2023-08-11 DIAGNOSIS — Z23 Encounter for immunization: Secondary | ICD-10-CM | POA: Diagnosis not present

## 2023-08-11 DIAGNOSIS — Z131 Encounter for screening for diabetes mellitus: Secondary | ICD-10-CM

## 2023-08-11 DIAGNOSIS — Z0000 Encounter for general adult medical examination without abnormal findings: Secondary | ICD-10-CM

## 2023-08-11 LAB — COMPREHENSIVE METABOLIC PANEL WITH GFR
ALT: 14 U/L (ref 0–35)
AST: 19 U/L (ref 0–37)
Albumin: 4.6 g/dL (ref 3.5–5.2)
Alkaline Phosphatase: 47 U/L (ref 39–117)
BUN: 10 mg/dL (ref 6–23)
CO2: 26 meq/L (ref 19–32)
Calcium: 9.6 mg/dL (ref 8.4–10.5)
Chloride: 103 meq/L (ref 96–112)
Creatinine, Ser: 0.73 mg/dL (ref 0.40–1.20)
GFR: 92.05 mL/min (ref >60.00)
Glucose, Bld: 94 mg/dL (ref 70–99)
Potassium: 4.7 meq/L (ref 3.5–5.1)
Sodium: 136 meq/L (ref 135–145)
Total Bilirubin: 0.6 mg/dL (ref 0.2–1.2)
Total Protein: 7.5 g/dL (ref 6.0–8.3)

## 2023-08-11 LAB — CBC
HCT: 39.6 % (ref 36.0–46.0)
Hemoglobin: 13.1 g/dL (ref 12.0–15.0)
MCHC: 33 g/dL (ref 30.0–36.0)
MCV: 92 fl (ref 78.0–100.0)
Platelets: 272 K/uL (ref 150.0–400.0)
RBC: 4.31 Mil/uL (ref 3.87–5.11)
RDW: 13.1 % (ref 11.5–15.5)
WBC: 6.1 K/uL (ref 4.0–10.5)

## 2023-08-11 LAB — LIPID PANEL
Cholesterol: 255 mg/dL — ABNORMAL HIGH (ref 0–200)
HDL: 74.1 mg/dL (ref >39.00)
LDL Cholesterol: 153 mg/dL — ABNORMAL HIGH (ref 0–99)
NonHDL: 180.59
Total CHOL/HDL Ratio: 3
Triglycerides: 139 mg/dL (ref 0.0–149.0)
VLDL: 27.8 mg/dL (ref 0.0–40.0)

## 2023-08-11 LAB — TSH: TSH: 2.71 u[IU]/mL (ref 0.35–5.50)

## 2023-08-11 LAB — HEMOGLOBIN A1C: Hgb A1c MFr Bld: 5.6 % (ref 4.6–6.5)

## 2023-08-11 MED ORDER — PAROXETINE HCL 10 MG PO TABS
10.0000 mg | ORAL_TABLET | ORAL | 3 refills | Status: AC
Start: 2023-08-11 — End: ?

## 2023-08-11 NOTE — Progress Notes (Unsigned)
 Subjective:  Patient ID: Lori Wheeler, female    DOB: [birth year]  Age: 58 y.o. MRN: [id number]  CC:  Chief Complaint  Patient presents with   Annual Exam    Physical;  New prescription for Paxil ; pt is fasting for labs    HPI Lori Wheeler presents for Annual Exam  PCP, [facility], Dr. San, colonoscopy in April 2024.  History of hemorrhoids, few small polyps removed.  Nonbleeding internal hemorrhoids.  Repeat 5 years.  1 polyp was a sessile serrated polyp, other was  benign hyperplastic polyp. Gynecology, [facility]- Cassandra Law. Appt June 9th treated with HRT. Mammogram in June reported as normal,  pap planned next year. Sister with recent diagnosis of breast CA - she is doing ok - had chemo and cancer decreased in size - plan for lumpectomy.    Hyperlipidemia: Current meds, low ASCVD risk score previously.  Fasting today. No FH of early CAD in 1st degree relatives.  The 10-year ASCVD risk score (Arnett DK, et al., 2019) is: 1.8%   Values used to calculate the score:     Age: 68 years     Clincally relevant sex: Female     Is Non-Hispanic African American: No     Diabetic: No     Tobacco smoker: No     Systolic Blood Pressure: 110 mmHg     Is BP treated: No     HDL Cholesterol: 66.5 mg/dL     Total Cholesterol: 260 mg/dL  Lab Results  Component Value Date   CHOL 260 (H) 09/21/2021   HDL 66.50 09/21/2021   LDLCALC 122 (H) 09/14/2019   LDLDIRECT 154.0 09/21/2021   TRIG 220.0 (H) 09/21/2021   CHOLHDL 4 09/21/2021   Lab Results  Component Value Date   ALT 17 09/21/2021   AST 23 09/21/2021   ALKPHOS 56 09/21/2021   BILITOT 0.7 09/21/2021    Generalized anxiety disorder Paxil  10 mg daily, working well. No new side effects.     08/11/2023    9:50 AM 12/17/2022   10:42 AM 04/05/2022    9:44 AM 09/21/2021    9:07 AM  GAD 7 : Generalized Anxiety Score  Nervous, Anxious, on Edge 0 0 0 0  Control/stop worrying 0 0 0 0  Worry too much -  different things 0 0 0 0  Trouble relaxing 0 0 0 0  Restless 0 0 0 0  Easily annoyed or irritable 0 0 0 0  Afraid - awful might happen 0 0 0 0  Total GAD 7 Score 0 0 0 0  Anxiety Difficulty Not difficult at all Not difficult at all Not difficult at all Not difficult at all       08/11/2023    9:50 AM 12/17/2022   10:41 AM 04/05/2022    9:43 AM 09/21/2021    8:24 AM 08/28/2020    1:25 PM  Depression screen PHQ 2/9  Decreased Interest 0 0 0 0 0  Down, Depressed, Hopeless 0 0 0 0 0  PHQ - 2 Score 0 0 0 0 0  Altered sleeping 0 0 0 0   Tired, decreased energy 0 0 0 0   Change in appetite 0 0 0 0   Feeling bad or failure about yourself  0 0 0 0   Trouble concentrating 0 0 0 0   Moving slowly or fidgety/restless 0 0 0 0   Suicidal thoughts 0 0 0 0   PHQ-9 Score 0  0 0 0   Difficult doing work/chores Not difficult at all Not difficult at all Not difficult at all      Health Maintenance  Topic Date Due   Hepatitis B Vaccines (1 of 3 - 19+ 3-dose series) Never done   DTaP/Tdap/Td (3 - Td or Tdap) 08/19/2022   COVID-19 Vaccine (5 - 2024-25 season) 09/22/2022   Zoster Vaccines- Shingrix (2 of 2) 11/11/2023 (Originally 04/14/2020)   Cervical Cancer Screening (HPV/Pap Cotest)  08/10/2024 (Originally 05/14/1997)   INFLUENZA VACCINE  08/22/2023   MAMMOGRAM  06/29/2024   Colonoscopy  04/26/2027   Hepatitis C Screening  Completed   HIV Screening  Completed   HPV VACCINES  Aged Out   Meningococcal B Vaccine  Aged Out  Colonoscopy last year as above, repeat 5 years Mammogram - June - normal  Pap testing with GYN - planned next year.    Immunization History  Administered Date(s) Administered   Influenza,inj,Quad PF,6+ Mos 12/30/2017, 10/21/2018, 09/17/2019, 09/21/2021   Influenza,trivalent, recombinat, inj, PF 12/09/2022   Influenza-Unspecified 12/09/2022   PFIZER(Purple Top)SARS-COV-2 Vaccination 04/15/2019, 05/06/2019, 12/02/2019   Pfizer Covid-19 Vaccine Bivalent Booster 58yrs & up  11/20/2020   Td 01/21/2002   Tdap 08/18/2012   Zoster Recombinant(Shingrix) 02/18/2020  Initial COVID-vaccine and booster, disease x 2 and declines further booster/vaccine. Tdap recommended.  Hep B vaccine - not sure if had - titer today Had 2nd shingrix at [facility].   No results found. Optho visit recently - yearly - no changes.   Dental: every 6 months.   Alcohol: 6 per week. No changes.   Tobacco: none.  Exercise: recent increase. Peloton, walking, running.    History Patient Active Problem List   Diagnosis Date Noted   Generalized anxiety disorder 02/17/2015   Past Medical History:  Diagnosis Date   Allergy    Claritin daily   Anxiety    Past Surgical History:  Procedure Laterality Date   Admission     [facility] admission; 2 weeks.   COLONOSCOPY     MANDIBLE SURGERY  01/21/1989   No Known Allergies Prior to Admission medications   Medication Sig Start Date End Date Taking? Authorizing Provider  b complex vitamins tablet Take 1 tablet by mouth daily.   Yes [provider]  Calcium-Magnesium 100-50 MG TABS Take 1 tablet by mouth daily.   Yes [provider]  estradiol (ESTRACE) 1 MG tablet Take 1 mg by mouth daily.   Yes [provider]  loratadine (CLARITIN) 10 MG tablet Take 10 mg by mouth daily.   Yes [provider]  Multiple Vitamin (MULTIVITAMIN) tablet Take 1 tablet by mouth daily.   Yes [provider]  ondansetron  (ZOFRAN ) 4 MG tablet Take 1 tablet (4 mg total) by mouth every 8 (eight) hours as needed for nausea or vomiting. 12/17/22  Yes Tabori, Katherine E, MD  PARoxetine  (PAXIL ) 10 MG tablet Take 1 tablet (10 mg total) by mouth every morning. 07/22/23  Yes Levora Reyes SAUNDERS, MD  predniSONE  (DELTASONE ) 20 MG tablet Take 2 tablets (40 mg total) by mouth daily with breakfast. 01/28/23  Yes Webb, Padonda B, FNP  progesterone (PROMETRIUM) 100 MG capsule Take 100 mg by mouth daily.   Yes [provider]   promethazine -dextromethorphan (PROMETHAZINE -DM) 6.25-15 MG/5ML syrup Take 5 mLs by mouth 4 (four) times daily as needed. 01/28/23  Yes Webb, Padonda B, FNP  VITAMIN D , CHOLECALCIFEROL, PO Take 1,000 Units by mouth daily.   Yes [provider]  lidocaine  (XYLOCAINE ) 2 % solution 5 ml swish and swallow every 6 hrs as needed Patient not taking: Reported on 08/11/2023 04/05/22   Mahlon Comer BRAVO, MD   Social History   Socioeconomic History   Marital status: Married    Spouse name: Not on file   Number of children: 0   Years of education: Not on file   Highest education level: Not on file  Occupational History   Occupation: Quarry manager  Tobacco Use   Smoking status: Former    Current packs/day: 0.00    Types: Cigarettes    Start date: 01/21/1982    Quit date: 01/22/1983    Years since quitting: 40.5   Smokeless tobacco: Never   Tobacco comments:    high school only   Vaping Use   Vaping status: Never Used  Substance and Sexual Activity   Alcohol use: Not Currently    Comment: beer - 6 pack/week   Drug use: Never   Sexual activity: Yes    Birth control/protection: Post-menopausal  Other Topics Concern   Not on file  Social History Narrative   Marital status:  Married x 28 years      Children: none; one dog      Lives: with husband, dog.       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      Diet:  VEGETARIAN; Tofu and beans; smoothies with protein powder; B complex   Social Drivers of Corporate investment banker Strain: Not on file  Food Insecurity: Not on file  Transportation Needs: Not on file  Physical Activity: Not on file  Stress: Not on file  Social Connections: Not on file  Intimate Partner Violence: Not on file    Review of Systems  13 point review of systems per patient health survey noted.  Negative other  than as indicated above or in HPI.   Objective:   Vitals:   08/11/23 0935  BP: 110/60  Pulse: 65  Temp: 98 F (36.7 C)  SpO2: 100%  Weight: 160 lb (72.6 kg)  Height: 5' 2.5 (1.588 m)   {Vitals History (Optional):23777}  Physical Exam Vitals reviewed.  Constitutional:      Appearance: She is well-developed.  HENT:     Head: Normocephalic and atraumatic.     Right Ear: External ear normal.     Left Ear: External ear normal.  Eyes:     Conjunctiva/sclera: Conjunctivae normal.     Pupils: Pupils are equal, round, and reactive to light.  Neck:     Thyroid: No thyromegaly.  Cardiovascular:     Rate and Rhythm: Normal rate and regular rhythm.     Heart sounds: Normal heart sounds. No murmur heard. Pulmonary:     Effort: Pulmonary effort is normal. No respiratory distress.     Breath sounds: Normal breath sounds. No wheezing.  Abdominal:     General: Bowel sounds are normal.     Palpations: Abdomen is soft.     Tenderness: There is no abdominal tenderness.  Musculoskeletal:        General: No tenderness. Normal range of motion.     Cervical back: Normal range of motion and neck supple.  Lymphadenopathy:     Cervical: No  cervical adenopathy.  Skin:    General: Skin is warm and dry.     Findings: No rash.  Neurological:     Mental Status: She is alert and oriented to person, place, and time.  Psychiatric:        Behavior: Behavior normal.        Thought Content: Thought content normal.        Assessment & Plan:  Lori Wheeler is a 58 y.o. female . Annual physical exam - Plan: CBC, TSH  Generalized anxiety disorder - Plan: PARoxetine  (PAXIL ) 10 MG tablet, TSH  Immunity status testing - Plan: Hepatitis B surface antibody,quantitative  Need for diphtheria-tetanus-pertussis (Tdap) vaccine - Plan: Tdap vaccine greater than or equal to 7yo IM  Screening for diabetes mellitus - Plan: Comprehensive metabolic panel with GFR, Hemoglobin A1c  Screening for  thyroid disorder - Plan: TSH  Screening, anemia, deficiency, iron - Plan: CBC  Hyperlipidemia, unspecified hyperlipidemia type - Plan: Comprehensive metabolic panel with GFR, Lipid panel, Hemoglobin A1c   Meds ordered this encounter  Medications   PARoxetine  (PAXIL ) 10 MG tablet    Sig: Take 1 tablet (10 mg total) by mouth every morning.    Dispense:  90 tablet    Refill:  3   Patient Instructions  Tdap vaccine updated today.  Thank you for coming in today. No change in medications at this time. If there are any concerns on your bloodwork, I will let you know. Take care!   Preventive Care 39-64 Years Old, Female Preventive care refers to lifestyle choices and visits with your health care provider that can promote health and wellness. Preventive care visits are also called wellness exams. What can I expect for my preventive care visit? Counseling Your health care provider may ask you questions about your: Medical history, including: Past medical problems. Family medical history. Pregnancy history. Current health, including: Menstrual cycle. Method of birth control. Emotional well-being. Home life and relationship well-being. Sexual activity and sexual health. Lifestyle, including: Alcohol, nicotine or tobacco, and drug use. Access to firearms. Diet, exercise, and sleep habits. Work and work Astronomer. Sunscreen use. Safety issues such as seatbelt and bike helmet use. Physical exam Your health care provider will check your: Height and weight. These may be used to calculate your BMI (body mass index). BMI is a measurement that tells if you are at a healthy weight. Waist circumference. This measures the distance around your waistline. This measurement also tells if you are at a healthy weight and may help predict your risk of certain diseases, such as type 2 diabetes and high blood pressure. Heart rate and blood pressure. Body temperature. Skin for abnormal spots. What  immunizations do I need?  Vaccines are usually given at various ages, according to a schedule. Your health care provider will recommend vaccines for you based on your age, medical history, and lifestyle or other factors, such as travel or where you work. What tests do I need? Screening Your health care provider may recommend screening tests for certain conditions. This may include: Lipid and cholesterol levels. Diabetes screening. This is done by checking your blood sugar (glucose) after you have not eaten for a while (fasting). Pelvic exam and Pap test. Hepatitis B test. Hepatitis C test. HIV (human immunodeficiency virus) test. STI (sexually transmitted infection) testing, if you are at risk. Lung cancer screening. Colorectal cancer screening. Mammogram. Talk with your health care provider about when you should start having regular mammograms. This may depend on whether  you have a family history of breast cancer. BRCA-related cancer screening. This may be done if you have a family history of breast, ovarian, tubal, or peritoneal cancers. Bone density scan. This is done to screen for osteoporosis. Talk with your health care provider about your test results, treatment options, and if necessary, the need for more tests. Follow these instructions at home: Eating and drinking  Eat a diet that includes fresh fruits and vegetables, whole grains, lean protein, and low-fat dairy products. Take vitamin and mineral supplements as recommended by your health care provider. Do not drink alcohol if: Your health care provider tells you not to drink. You are pregnant, may be pregnant, or are planning to become pregnant. If you drink alcohol: Limit how much you have to 0-1 drink a day. Know how much alcohol is in your drink. In the [place] one drink equals one 12 oz bottle of beer (355 mL), one 5 oz glass of wine (148 mL), or one 1 oz glass of hard liquor (44 mL). Lifestyle Brush your teeth every  morning and night with fluoride toothpaste. Floss one time each day. Exercise for at least 30 minutes 5 or more days each week. Do not use any products that contain nicotine or tobacco. These products include cigarettes, chewing tobacco, and vaping devices, such as e-cigarettes. If you need help quitting, ask your health care provider. Do not use drugs. If you are sexually active, practice safe sex. Use a condom or other form of protection to prevent STIs. If you do not wish to become pregnant, use a form of birth control. If you plan to become pregnant, see your health care provider for a prepregnancy visit. Take aspirin only as told by your health care provider. Make sure that you understand how much to take and what form to take. Work with your health care provider to find out whether it is safe and beneficial for you to take aspirin daily. Find healthy ways to manage stress, such as: Meditation, yoga, or listening to music. Journaling. Talking to a trusted person. Spending time with friends and family. Minimize exposure to UV radiation to reduce your risk of skin cancer. Safety Always wear your seat belt while driving or riding in a vehicle. Do not drive: If you have been drinking alcohol. Do not ride with someone who has been drinking. When you are tired or distracted. While texting. If you have been using any mind-altering substances or drugs. Wear a helmet and other protective equipment during sports activities. If you have firearms in your house, make sure you follow all gun safety procedures. Seek help if you have been physically or sexually abused. What's next? Visit your health care provider once a year for an annual wellness visit. Ask your health care provider how often you should have your eyes and teeth checked. Stay up to date on all vaccines. This information is not intended to replace advice given to you by your health care provider. Make sure you discuss any questions  you have with your health care provider. Document Revised: 07/05/2020 Document Reviewed: 07/05/2020 Elsevier Patient Education  2024 Elsevier Inc.    Signed,   Reyes Pines, MD [facility], [facility] 08/11/23 10:29 AM

## 2023-08-11 NOTE — Patient Instructions (Signed)
 Tdap vaccine updated today.  Thank you for coming in today. No change in medications at this time. If there are any concerns on your bloodwork, I will let you know. Take care!   Preventive Care 74-56 Years Old, Female Preventive care refers to lifestyle choices and visits with your health care provider that can promote health and wellness. Preventive care visits are also called wellness exams. What can I expect for my preventive care visit? Counseling Your health care provider may ask you questions about your: Medical history, including: Past medical problems. Family medical history. Pregnancy history. Current health, including: Menstrual cycle. Method of birth control. Emotional well-being. Home life and relationship well-being. Sexual activity and sexual health. Lifestyle, including: Alcohol, nicotine or tobacco, and drug use. Access to firearms. Diet, exercise, and sleep habits. Work and work Astronomer. Sunscreen use. Safety issues such as seatbelt and bike helmet use. Physical exam Your health care provider will check your: Height and weight. These may be used to calculate your BMI (body mass index). BMI is a measurement that tells if you are at a healthy weight. Waist circumference. This measures the distance around your waistline. This measurement also tells if you are at a healthy weight and may help predict your risk of certain diseases, such as type 2 diabetes and high blood pressure. Heart rate and blood pressure. Body temperature. Skin for abnormal spots. What immunizations do I need?  Vaccines are usually given at various ages, according to a schedule. Your health care provider will recommend vaccines for you based on your age, medical history, and lifestyle or other factors, such as travel or where you work. What tests do I need? Screening Your health care provider may recommend screening tests for certain conditions. This may include: Lipid and cholesterol  levels. Diabetes screening. This is done by checking your blood sugar (glucose) after you have not eaten for a while (fasting). Pelvic exam and Pap test. Hepatitis B test. Hepatitis C test. HIV (human immunodeficiency virus) test. STI (sexually transmitted infection) testing, if you are at risk. Lung cancer screening. Colorectal cancer screening. Mammogram. Talk with your health care provider about when you should start having regular mammograms. This may depend on whether you have a family history of breast cancer. BRCA-related cancer screening. This may be done if you have a family history of breast, ovarian, tubal, or peritoneal cancers. Bone density scan. This is done to screen for osteoporosis. Talk with your health care provider about your test results, treatment options, and if necessary, the need for more tests. Follow these instructions at home: Eating and drinking  Eat a diet that includes fresh fruits and vegetables, whole grains, lean protein, and low-fat dairy products. Take vitamin and mineral supplements as recommended by your health care provider. Do not drink alcohol if: Your health care provider tells you not to drink. You are pregnant, may be pregnant, or are planning to become pregnant. If you drink alcohol: Limit how much you have to 0-1 drink a day. Know how much alcohol is in your drink. In the [place] one drink equals one 12 oz bottle of beer (355 mL), one 5 oz glass of wine (148 mL), or one 1 oz glass of hard liquor (44 mL). Lifestyle Brush your teeth every morning and night with fluoride toothpaste. Floss one time each day. Exercise for at least 30 minutes 5 or more days each week. Do not use any products that contain nicotine or tobacco. These products include cigarettes, chewing tobacco, and vaping  devices, such as e-cigarettes. If you need help quitting, ask your health care provider. Do not use drugs. If you are sexually active, practice safe sex. Use a  condom or other form of protection to prevent STIs. If you do not wish to become pregnant, use a form of birth control. If you plan to become pregnant, see your health care provider for a prepregnancy visit. Take aspirin only as told by your health care provider. Make sure that you understand how much to take and what form to take. Work with your health care provider to find out whether it is safe and beneficial for you to take aspirin daily. Find healthy ways to manage stress, such as: Meditation, yoga, or listening to music. Journaling. Talking to a trusted person. Spending time with friends and family. Minimize exposure to UV radiation to reduce your risk of skin cancer. Safety Always wear your seat belt while driving or riding in a vehicle. Do not drive: If you have been drinking alcohol. Do not ride with someone who has been drinking. When you are tired or distracted. While texting. If you have been using any mind-altering substances or drugs. Wear a helmet and other protective equipment during sports activities. If you have firearms in your house, make sure you follow all gun safety procedures. Seek help if you have been physically or sexually abused. What's next? Visit your health care provider once a year for an annual wellness visit. Ask your health care provider how often you should have your eyes and teeth checked. Stay up to date on all vaccines. This information is not intended to replace advice given to you by your health care provider. Make sure you discuss any questions you have with your health care provider. Document Revised: 07/05/2020 Document Reviewed: 07/05/2020 Elsevier Patient Education  2024 [employer].

## 2023-08-12 ENCOUNTER — Ambulatory Visit: Payer: Self-pay | Admitting: Family Medicine

## 2023-08-12 ENCOUNTER — Encounter: Payer: Self-pay | Admitting: Obstetrics and Gynecology

## 2023-08-12 LAB — HEPATITIS B SURFACE ANTIBODY, QUANTITATIVE: Hep B S AB Quant (Post): <5 m[IU]/mL — ABNORMAL LOW (ref >=10)

## 2023-08-13 ENCOUNTER — Ambulatory Visit: Payer: Self-pay | Admitting: Family Medicine

## 2023-08-27 ENCOUNTER — Ambulatory Visit

## 2024-08-11 ENCOUNTER — Encounter: Admitting: Family Medicine
